# Patient Record
Sex: Female | Born: 1970
Health system: Southern US, Community
[De-identification: ages and names within clinical notes are randomized; demographics above are authoritative.]

## PROBLEM LIST (undated history)

## (undated) DIAGNOSIS — J45909 Unspecified asthma, uncomplicated: Secondary | ICD-10-CM

## (undated) DIAGNOSIS — E119 Type 2 diabetes mellitus without complications: Secondary | ICD-10-CM

## (undated) DIAGNOSIS — I10 Essential (primary) hypertension: Secondary | ICD-10-CM

## (undated) DIAGNOSIS — I509 Heart failure, unspecified: Secondary | ICD-10-CM

## (undated) HISTORY — DX: Heart failure, unspecified: I50.9

---

## 1999-01-02 HISTORY — PX: TUBAL LIGATION: SHX77

## 2018-10-31 ENCOUNTER — Other Ambulatory Visit: Payer: Self-pay

## 2018-10-31 ENCOUNTER — Inpatient Hospital Stay (HOSPITAL_COMMUNITY)
Admission: EM | Admit: 2018-10-31 | Discharge: 2018-11-07 | DRG: 286 | Disposition: A | Discharge status: Home Health | Payer: Self-pay | Attending: Family Medicine | Admitting: Family Medicine

## 2018-10-31 ENCOUNTER — Encounter (HOSPITAL_COMMUNITY): Payer: Self-pay | Admitting: Pharmacy Technician

## 2018-10-31 ENCOUNTER — Emergency Department (HOSPITAL_COMMUNITY): Payer: Self-pay

## 2018-10-31 DIAGNOSIS — E8881 Metabolic syndrome: Secondary | ICD-10-CM | POA: Diagnosis present

## 2018-10-31 DIAGNOSIS — J9601 Acute respiratory failure with hypoxia: Secondary | ICD-10-CM | POA: Diagnosis present

## 2018-10-31 DIAGNOSIS — Z8249 Family history of ischemic heart disease and other diseases of the circulatory system: Secondary | ICD-10-CM

## 2018-10-31 DIAGNOSIS — J9602 Acute respiratory failure with hypercapnia: Secondary | ICD-10-CM | POA: Diagnosis present

## 2018-10-31 DIAGNOSIS — I5043 Acute on chronic combined systolic (congestive) and diastolic (congestive) heart failure: Secondary | ICD-10-CM | POA: Diagnosis present

## 2018-10-31 DIAGNOSIS — I130 Hypertensive heart and chronic kidney disease with heart failure and stage 1 through stage 4 chronic kidney disease, or unspecified chronic kidney disease: Principal | ICD-10-CM | POA: Diagnosis present

## 2018-10-31 DIAGNOSIS — Z23 Encounter for immunization: Secondary | ICD-10-CM

## 2018-10-31 DIAGNOSIS — Z09 Encounter for follow-up examination after completed treatment for conditions other than malignant neoplasm: Secondary | ICD-10-CM

## 2018-10-31 DIAGNOSIS — I509 Heart failure, unspecified: Secondary | ICD-10-CM

## 2018-10-31 DIAGNOSIS — Z86718 Personal history of other venous thrombosis and embolism: Secondary | ICD-10-CM

## 2018-10-31 DIAGNOSIS — Z20828 Contact with and (suspected) exposure to other viral communicable diseases: Secondary | ICD-10-CM | POA: Diagnosis present

## 2018-10-31 DIAGNOSIS — Z9119 Patient's noncompliance with other medical treatment and regimen: Secondary | ICD-10-CM

## 2018-10-31 DIAGNOSIS — Z841 Family history of disorders of kidney and ureter: Secondary | ICD-10-CM

## 2018-10-31 DIAGNOSIS — E873 Alkalosis: Secondary | ICD-10-CM | POA: Diagnosis not present

## 2018-10-31 DIAGNOSIS — E119 Type 2 diabetes mellitus without complications: Secondary | ICD-10-CM

## 2018-10-31 DIAGNOSIS — R918 Other nonspecific abnormal finding of lung field: Secondary | ICD-10-CM | POA: Diagnosis present

## 2018-10-31 DIAGNOSIS — N289 Disorder of kidney and ureter, unspecified: Secondary | ICD-10-CM

## 2018-10-31 DIAGNOSIS — I10 Essential (primary) hypertension: Secondary | ICD-10-CM | POA: Diagnosis present

## 2018-10-31 DIAGNOSIS — E876 Hypokalemia: Secondary | ICD-10-CM | POA: Diagnosis not present

## 2018-10-31 DIAGNOSIS — I890 Lymphedema, not elsewhere classified: Secondary | ICD-10-CM | POA: Diagnosis present

## 2018-10-31 DIAGNOSIS — G9349 Other encephalopathy: Secondary | ICD-10-CM | POA: Diagnosis present

## 2018-10-31 DIAGNOSIS — E662 Morbid (severe) obesity with alveolar hypoventilation: Secondary | ICD-10-CM | POA: Diagnosis present

## 2018-10-31 DIAGNOSIS — N182 Chronic kidney disease, stage 2 (mild): Secondary | ICD-10-CM | POA: Diagnosis present

## 2018-10-31 DIAGNOSIS — N179 Acute kidney failure, unspecified: Secondary | ICD-10-CM | POA: Diagnosis present

## 2018-10-31 DIAGNOSIS — J189 Pneumonia, unspecified organism: Secondary | ICD-10-CM | POA: Diagnosis present

## 2018-10-31 DIAGNOSIS — Z6841 Body Mass Index (BMI) 40.0 and over, adult: Secondary | ICD-10-CM

## 2018-10-31 DIAGNOSIS — J45909 Unspecified asthma, uncomplicated: Secondary | ICD-10-CM | POA: Diagnosis present

## 2018-10-31 DIAGNOSIS — R0902 Hypoxemia: Secondary | ICD-10-CM

## 2018-10-31 DIAGNOSIS — Z79899 Other long term (current) drug therapy: Secondary | ICD-10-CM

## 2018-10-31 DIAGNOSIS — J45901 Unspecified asthma with (acute) exacerbation: Secondary | ICD-10-CM | POA: Diagnosis present

## 2018-10-31 DIAGNOSIS — I272 Pulmonary hypertension, unspecified: Secondary | ICD-10-CM | POA: Diagnosis present

## 2018-10-31 DIAGNOSIS — E1122 Type 2 diabetes mellitus with diabetic chronic kidney disease: Secondary | ICD-10-CM | POA: Diagnosis present

## 2018-10-31 DIAGNOSIS — J9690 Respiratory failure, unspecified, unspecified whether with hypoxia or hypercapnia: Secondary | ICD-10-CM

## 2018-10-31 DIAGNOSIS — I428 Other cardiomyopathies: Secondary | ICD-10-CM | POA: Diagnosis present

## 2018-10-31 DIAGNOSIS — Z825 Family history of asthma and other chronic lower respiratory diseases: Secondary | ICD-10-CM

## 2018-10-31 HISTORY — DX: Unspecified asthma, uncomplicated: J45.909

## 2018-10-31 HISTORY — DX: Type 2 diabetes mellitus without complications: E11.9

## 2018-10-31 HISTORY — DX: Essential (primary) hypertension: I10

## 2018-10-31 LAB — CBC WITH DIFFERENTIAL/PLATELET
Abs Immature Granulocytes: 0.05 10*3/uL (ref 0.00–0.07)
Basophils Absolute: 0 10*3/uL (ref 0.0–0.1)
Basophils Relative: 0 %
Eosinophils Absolute: 0 10*3/uL (ref 0.0–0.5)
Eosinophils Relative: 0 %
HCT: 41.6 % (ref 36.0–46.0)
Hemoglobin: 12.5 g/dL (ref 12.0–15.0)
Immature Granulocytes: 1 %
Lymphocytes Relative: 9 %
Lymphs Abs: 1 10*3/uL (ref 0.7–4.0)
MCH: 29.3 pg (ref 26.0–34.0)
MCHC: 30 g/dL (ref 30.0–36.0)
MCV: 97.7 fL (ref 80.0–100.0)
Monocytes Absolute: 0.7 10*3/uL (ref 0.1–1.0)
Monocytes Relative: 6 %
Neutro Abs: 9 10*3/uL — ABNORMAL HIGH (ref 1.7–7.7)
Neutrophils Relative %: 84 %
Platelets: 366 10*3/uL (ref 150–400)
RBC: 4.26 MIL/uL (ref 3.87–5.11)
RDW: 16 % — ABNORMAL HIGH (ref 11.5–15.5)
WBC: 10.7 10*3/uL — ABNORMAL HIGH (ref 4.0–10.5)
nRBC: 0 % (ref 0.0–0.2)

## 2018-10-31 LAB — COMPREHENSIVE METABOLIC PANEL
ALT: 23 U/L (ref 0–44)
AST: 13 U/L — ABNORMAL LOW (ref 15–41)
Albumin: 3.3 g/dL — ABNORMAL LOW (ref 3.5–5.0)
Alkaline Phosphatase: 72 U/L (ref 38–126)
Anion gap: 10 (ref 5–15)
BUN: 21 mg/dL — ABNORMAL HIGH (ref 6–20)
CO2: 31 mmol/L (ref 22–32)
Calcium: 8.9 mg/dL (ref 8.9–10.3)
Chloride: 101 mmol/L (ref 98–111)
Creatinine, Ser: 1.45 mg/dL — ABNORMAL HIGH (ref 0.44–1.00)
GFR calc Af Amer: 50 mL/min — ABNORMAL LOW (ref >60)
GFR calc non Af Amer: 43 mL/min — ABNORMAL LOW (ref >60)
Glucose, Bld: 120 mg/dL — ABNORMAL HIGH (ref 70–99)
Potassium: 3.4 mmol/L — ABNORMAL LOW (ref 3.5–5.1)
Sodium: 142 mmol/L (ref 135–145)
Total Bilirubin: 0.5 mg/dL (ref 0.3–1.2)
Total Protein: 6.9 g/dL (ref 6.5–8.1)

## 2018-10-31 LAB — BRAIN NATRIURETIC PEPTIDE: B Natriuretic Peptide: 931.6 pg/mL — ABNORMAL HIGH (ref 0.0–100.0)

## 2018-10-31 NOTE — ED Triage Notes (Signed)
Pt arrives POV with hx of asthma and out of medications. Pt recently moved here and has not established care with a new PCP. Over the last few days pt has become more SOB. Pt in NAD.

## 2018-10-31 NOTE — ED Notes (Signed)
Patient states she needs asthma medicine.

## 2018-10-31 NOTE — ED Notes (Signed)
Pt oxygen saturation improved to 95% on 2L .

## 2018-11-01 ENCOUNTER — Encounter (HOSPITAL_COMMUNITY): Payer: Self-pay | Admitting: Family Medicine

## 2018-11-01 DIAGNOSIS — J189 Pneumonia, unspecified organism: Secondary | ICD-10-CM | POA: Diagnosis present

## 2018-11-01 DIAGNOSIS — I10 Essential (primary) hypertension: Secondary | ICD-10-CM | POA: Diagnosis present

## 2018-11-01 DIAGNOSIS — N289 Disorder of kidney and ureter, unspecified: Secondary | ICD-10-CM

## 2018-11-01 DIAGNOSIS — I509 Heart failure, unspecified: Secondary | ICD-10-CM

## 2018-11-01 DIAGNOSIS — I5022 Chronic systolic (congestive) heart failure: Secondary | ICD-10-CM | POA: Insufficient documentation

## 2018-11-01 DIAGNOSIS — J9691 Respiratory failure, unspecified with hypoxia: Secondary | ICD-10-CM

## 2018-11-01 DIAGNOSIS — J9601 Acute respiratory failure with hypoxia: Secondary | ICD-10-CM | POA: Diagnosis present

## 2018-11-01 DIAGNOSIS — Z86718 Personal history of other venous thrombosis and embolism: Secondary | ICD-10-CM

## 2018-11-01 DIAGNOSIS — J45909 Unspecified asthma, uncomplicated: Secondary | ICD-10-CM | POA: Diagnosis present

## 2018-11-01 DIAGNOSIS — E119 Type 2 diabetes mellitus without complications: Secondary | ICD-10-CM

## 2018-11-01 HISTORY — DX: Acute respiratory failure with hypoxia: J96.01

## 2018-11-01 HISTORY — DX: Respiratory failure, unspecified with hypoxia: J96.91

## 2018-11-01 LAB — GLUCOSE, CAPILLARY
Glucose-Capillary: 162 mg/dL — ABNORMAL HIGH (ref 70–99)
Glucose-Capillary: 199 mg/dL — ABNORMAL HIGH (ref 70–99)
Glucose-Capillary: 167 mg/dL — ABNORMAL HIGH (ref 70–99)

## 2018-11-01 LAB — SARS CORONAVIRUS 2 (TAT 6-24 HRS): SARS Coronavirus 2: NEGATIVE

## 2018-11-01 LAB — STREP PNEUMONIAE URINARY ANTIGEN: Strep Pneumo Urinary Antigen: NEGATIVE

## 2018-11-01 LAB — MAGNESIUM: Magnesium: 1.8 mg/dL (ref 1.7–2.4)

## 2018-11-01 LAB — TROPONIN I (HIGH SENSITIVITY)
Troponin I (High Sensitivity): 85 ng/L — ABNORMAL HIGH (ref <18)
Troponin I (High Sensitivity): 81 ng/L — ABNORMAL HIGH (ref <18)

## 2018-11-01 LAB — CBG MONITORING, ED: Glucose-Capillary: 171 mg/dL — ABNORMAL HIGH (ref 70–99)

## 2018-11-01 LAB — HEMOGLOBIN A1C
Hgb A1c MFr Bld: 8.5 % — ABNORMAL HIGH (ref 4.8–5.6)
Mean Plasma Glucose: 197.25 mg/dL

## 2018-11-01 LAB — HIV ANTIBODY (ROUTINE TESTING W REFLEX): HIV Screen 4th Generation wRfx: NONREACTIVE

## 2018-11-01 LAB — PROCALCITONIN: Procalcitonin: <0.1 ng/mL

## 2018-11-01 MED ORDER — HYDROCODONE-ACETAMINOPHEN 5-325 MG PO TABS
1.0000 | ORAL_TABLET | Freq: Four times a day (QID) | ORAL | Status: DC | PRN
  Administered 2018-11-01: 2 via ORAL
  Administered 2018-11-02: 1 via ORAL
  Filled 2018-11-01: qty 1
  Filled 2018-11-01: qty 2

## 2018-11-01 MED ORDER — POLYETHYLENE GLYCOL 3350 17 G PO PACK: 17.0000 g | PACK | Freq: Every day | ORAL | Status: DC | PRN

## 2018-11-01 MED ORDER — ACETAMINOPHEN 650 MG RE SUPP: 650.0000 mg | Freq: Four times a day (QID) | RECTAL | Status: DC | PRN

## 2018-11-01 MED ORDER — ENOXAPARIN SODIUM 40 MG/0.4ML ~~LOC~~ SOLN
40.0000 mg | SUBCUTANEOUS | Status: DC
  Administered 2018-11-01 – 2018-11-04 (×4): 40 mg via SUBCUTANEOUS
  Filled 2018-11-01 (×4): qty 0.4

## 2018-11-01 MED ORDER — FUROSEMIDE 10 MG/ML IJ SOLN
40.0000 mg | Freq: Two times a day (BID) | INTRAMUSCULAR | Status: DC
  Administered 2018-11-01 – 2018-11-04 (×7): 40 mg via INTRAVENOUS
  Filled 2018-11-01 (×7): qty 4

## 2018-11-01 MED ORDER — IPRATROPIUM BROMIDE HFA 17 MCG/ACT IN AERS
2.0000 | INHALATION_SPRAY | Freq: Once | RESPIRATORY_TRACT | Status: AC
  Administered 2018-11-01: 2 via RESPIRATORY_TRACT
  Filled 2018-11-01: qty 12.9

## 2018-11-01 MED ORDER — ALBUTEROL SULFATE HFA 108 (90 BASE) MCG/ACT IN AERS
6.0000 | INHALATION_SPRAY | Freq: Once | RESPIRATORY_TRACT | Status: AC
  Administered 2018-11-01: 6 via RESPIRATORY_TRACT
  Filled 2018-11-01: qty 6.7

## 2018-11-01 MED ORDER — ONDANSETRON HCL 4 MG/2ML IJ SOLN: 4.0000 mg | Freq: Four times a day (QID) | INTRAMUSCULAR | Status: DC | PRN

## 2018-11-01 MED ORDER — POTASSIUM CHLORIDE CRYS ER 20 MEQ PO TBCR
20.0000 meq | EXTENDED_RELEASE_TABLET | Freq: Every day | ORAL | Status: DC
  Administered 2018-11-01 – 2018-11-03 (×3): 20 meq via ORAL
  Filled 2018-11-01 (×4): qty 1

## 2018-11-01 MED ORDER — SODIUM CHLORIDE 0.9 % IV SOLN
500.0000 mg | INTRAVENOUS | Status: DC
  Administered 2018-11-01: 500 mg via INTRAVENOUS
  Filled 2018-11-01 (×2): qty 500

## 2018-11-01 MED ORDER — AMLODIPINE BESYLATE 10 MG PO TABS
10.0000 mg | ORAL_TABLET | Freq: Every day | ORAL | Status: DC
  Administered 2018-11-01 – 2018-11-04 (×4): 10 mg via ORAL
  Filled 2018-11-01 (×4): qty 1
  Filled 2018-11-01: qty 2

## 2018-11-01 MED ORDER — ACETAMINOPHEN 325 MG PO TABS
650.0000 mg | ORAL_TABLET | Freq: Four times a day (QID) | ORAL | Status: DC | PRN
  Administered 2018-11-02: 650 mg via ORAL
  Filled 2018-11-01: qty 2

## 2018-11-01 MED ORDER — POTASSIUM CHLORIDE 10 MEQ/100ML IV SOLN
10.0000 meq | Freq: Once | INTRAVENOUS | Status: AC
  Administered 2018-11-01: 10 meq via INTRAVENOUS
  Filled 2018-11-01: qty 100

## 2018-11-01 MED ORDER — IPRATROPIUM-ALBUTEROL 0.5-2.5 (3) MG/3ML IN SOLN
3.0000 mL | RESPIRATORY_TRACT | Status: DC
  Administered 2018-11-01 – 2018-11-02 (×3): 3 mL via RESPIRATORY_TRACT
  Filled 2018-11-01 (×3): qty 3

## 2018-11-01 MED ORDER — ALBUTEROL SULFATE (2.5 MG/3ML) 0.083% IN NEBU: 2.5000 mg | INHALATION_SOLUTION | Freq: Four times a day (QID) | RESPIRATORY_TRACT | Status: DC | PRN

## 2018-11-01 MED ORDER — METHYLPREDNISOLONE SODIUM SUCC 125 MG IJ SOLR
125.0000 mg | Freq: Once | INTRAMUSCULAR | Status: AC
  Administered 2018-11-01: 125 mg via INTRAVENOUS
  Filled 2018-11-01: qty 2

## 2018-11-01 MED ORDER — FLUTICASONE FUROATE-VILANTEROL 200-25 MCG/INH IN AEPB
1.0000 | INHALATION_SPRAY | Freq: Every day | RESPIRATORY_TRACT | Status: DC
  Filled 2018-11-01: qty 28

## 2018-11-01 MED ORDER — SODIUM CHLORIDE 0.9 % IV SOLN
250.0000 mL | INTRAVENOUS | Status: DC | PRN
  Administered 2018-11-02: 13:00:00 250 mL via INTRAVENOUS

## 2018-11-01 MED ORDER — SODIUM CHLORIDE 0.9% FLUSH
3.0000 mL | INTRAVENOUS | Status: DC | PRN
  Administered 2018-11-04: 10:00:00 3 mL via INTRAVENOUS
  Filled 2018-11-01: qty 3

## 2018-11-01 MED ORDER — MONTELUKAST SODIUM 10 MG PO TABS
10.0000 mg | ORAL_TABLET | Freq: Every day | ORAL | Status: DC
  Administered 2018-11-01 – 2018-11-06 (×6): 10 mg via ORAL
  Filled 2018-11-01 (×6): qty 1

## 2018-11-01 MED ORDER — AZITHROMYCIN 250 MG PO TABS
500.0000 mg | ORAL_TABLET | Freq: Every day | ORAL | Status: DC
  Administered 2018-11-02: 500 mg via ORAL
  Filled 2018-11-01: qty 2

## 2018-11-01 MED ORDER — SODIUM CHLORIDE 0.9 % IV SOLN
2.0000 g | INTRAVENOUS | Status: DC
  Administered 2018-11-01 – 2018-11-02 (×2): 2 g via INTRAVENOUS
  Filled 2018-11-01 (×2): qty 20

## 2018-11-01 MED ORDER — MAGNESIUM SULFATE 2 GM/50ML IV SOLN
2.0000 g | Freq: Once | INTRAVENOUS | Status: AC
  Administered 2018-11-01: 2 g via INTRAVENOUS
  Filled 2018-11-01: qty 50

## 2018-11-01 MED ORDER — FUROSEMIDE 10 MG/ML IJ SOLN
40.0000 mg | Freq: Once | INTRAMUSCULAR | Status: AC
  Administered 2018-11-01: 40 mg via INTRAVENOUS
  Filled 2018-11-01: qty 4

## 2018-11-01 MED ORDER — ONDANSETRON HCL 4 MG PO TABS: 4.0000 mg | ORAL_TABLET | Freq: Four times a day (QID) | ORAL | Status: DC | PRN

## 2018-11-01 MED ORDER — SODIUM CHLORIDE 0.9% FLUSH
3.0000 mL | Freq: Two times a day (BID) | INTRAVENOUS | Status: DC
  Administered 2018-11-01 – 2018-11-05 (×8): 3 mL via INTRAVENOUS

## 2018-11-01 MED ORDER — INSULIN ASPART 100 UNIT/ML ~~LOC~~ SOLN
0.0000 [IU] | Freq: Three times a day (TID) | SUBCUTANEOUS | Status: DC
  Administered 2018-11-01 (×3): 2 [IU] via SUBCUTANEOUS
  Administered 2018-11-02 – 2018-11-03 (×3): 1 [IU] via SUBCUTANEOUS
  Administered 2018-11-03: 09:00:00 3 [IU] via SUBCUTANEOUS
  Administered 2018-11-04: 18:00:00 1 [IU] via SUBCUTANEOUS

## 2018-11-01 NOTE — Progress Notes (Addendum)
A 48 year old female with past medical history of asthma, diabetes, OSA on CPAP, hypertension, morbid obesity, DVT, who presented to the ED with progressively worsening shortness of breath, initially on exertion now at rest and admitted for CHF exacerbation as well as right upper lobe pneumonia.  In the ED patient was tachycardic, hypoxic, tachypneic.  She had chest x-ray which showed right upper lobe consolidation concerning for pneumonia, right pleural effusion, severe cardiomegaly and pulmonary vascular congestion.  She had elevated creatinine 1.45 and BNP 932.  She was started on IV Lasix, albuterol, Atrovent and received 125 mg IV Solu-Medrol in the emergency department.  Today, patient admits that she has not noticed much weight gain as she has been intentionally losing weight has lost 21 pounds over the past couple months with increased activity and improved diet with carbohydrate restriction and salt restriction.  Has not had increased salt intake recently.  Notes that she has had worsening bilateral lower extremity edema up to her thighs.  Denies any recent sick contacts.  Admits to wheezing at home which has been uncontrollable with her home medications.    Otherwise review of systems negative  Physical Exam Vitals signs and nursing note reviewed.  Constitutional:      Appearance: She is obese.  HENT:     Head: Normocephalic.     Mouth/Throat:     Mouth: Mucous membranes are moist.  Eyes:     Extraocular Movements: Extraocular movements intact.  Cardiovascular:     Rate and Rhythm: Regular rhythm. Tachycardia present.     Comments: Distant heart sounds due to body habitus Pulmonary:     Breath sounds: Wheezing and rales present.     Comments: With some increased work of breathing, sitting upright with conversational dyspnea, satting well with nasal cannula.  Diffuse inspiratory and expiratory wheeze Musculoskeletal:     Comments: 2-3+ bilateral lower extremity pitting edema to  knees  Neurological:     General: No focal deficit present.     Mental Status: She is alert and oriented to person, place, and time.  Psychiatric:        Mood and Affect: Mood normal. Mood is not anxious.        Behavior: Behavior normal. Behavior is not agitated.    Assessment/plan  Acute hypoxic respiratory failure secondary to CAP and suspected new onset heart failure O2 sat in 80s on room air at presentation.  Currently satting well on 2 L nasal cannula -Treatment as below  Suspected new onset heart failure exacerbation Unknown etiology as patient has been compliant with low-salt and heart healthy diet at home to lose weight.  BNP may be falsely low at 932 in morbidly obese patient may actually be 1000+.  Mag sulfate 2 g Lasix 40 mg IV x2. -Continue Lasix 40 mg IV twice daily -Follow-up echo -Consider cardiology consult for new onset heart failure.  Likely need outpatient work-up  CAP with right upper lobe consolidation Mild leukocytosis.  Afebrile with negative procalcitonin -Follow-up sputum culture -Continue CAP therapy for now -Repeat labs and chest x-ray tomorrow morning  Asthma exacerbation secondary to CAP and new onset heart failure Received mag sulfate 2 g in ED.  Estimated peak flow 370 L/min -DuoNeb scheduled -Albuterol neb as needed worse -Peak flows  OSA on CPAP -Continue CPAP  Elevated creatinine Unknown baseline.  Suspect secondary to above -Continue Lasix -Follow-up in a.m.   Hypertension -Continue Norvasc and Lasix -Likely start beta-blocker once volume status is improved  Diabetes  HA1C 8.5  Continue sliding scale -Needs to establish care with PCP  History of DVT   Whitney Post, DO [facility] [phone]

## 2018-11-01 NOTE — TOC Initial Note (Signed)
Transition of Care [facility]) - Initial/Assessment Note    Patient Details  Name: Julie Benjamin MRN: [id number] Date of Birth: 02-12-1970  Transition of Care [facility]) CM/SW Contact:    Carles Collet, RN Phone Number: 11/01/2018, 3:11 PM  Clinical Narrative:         She moved to this area from [place] and has not yet established with a PCP or filled her medications. Patient is uninsured. Will continue to follow for discharge planning.  Will request CMA to establish patient at [facility], and watch for [place] needs at DC.             Expected Discharge Plan: Home/Self Care Barriers to Discharge: Continued Medical Work up   Patient Goals and CMS Choice        Expected Discharge Plan and Services Expected Discharge Plan: Home/Self Care                                              Prior Living Arrangements/Services                       Activities of Daily Living      Permission Sought/Granted                  Emotional Assessment              Admission diagnosis:  Hypoxia [R09.02] AKI (acute kidney injury) ([place]) [N17.9] Follow up [Z09] Acute heart failure, unspecified heart failure type [facility] [I50.9] Patient Active Problem List   Diagnosis Date Noted  . Acute respiratory failure with hypoxia ([place]) 11/01/2018  . Hypertension   . Diabetes mellitus without complication ([place])   . Asthma   . Right upper lobe pneumonia   . Renal insufficiency   . History of DVT of lower extremity   . Acute heart failure [facility])    PCP:  Patient, No Pcp Per Pharmacy:   [facility] [place], [place] - [address] [place] [address] Phone: [phone] Fax: [phone]     Social Determinants of Health (SDOH) Interventions    Readmission Risk Interventions No flowsheet data found.

## 2018-11-01 NOTE — ED Notes (Signed)
Lunch Tray Ordered @ 1043. 

## 2018-11-01 NOTE — H&P (Signed)
History and Physical    Shandie Bertz DPO:[id number] DOB: 03-18-1970 DOA: 10/31/2018  PCP: Patient, No Pcp Per   Patient coming from: Home   Chief Complaint: SOB   HPI: Chloie Loney is a 48 y.o. female with medical history significant for asthma, diabetes mellitus, and hypertension, now presenting to the emergency department with shortness of breath.  Patient reports chronic bilateral lower extremity edema, but has not had any lower extremity tenderness.  She also reports orthopnea but this seems to be chronic as well.  Over the past couple weeks, she has become increasingly short of breath.  Initially she was dyspneic with exertion, but now short of breath at rest as well.  She has not noted any fevers or chills, reports that she never coughs much, and denies any chest pain associated with this.  She reports a history of asthma but denies any wheezing recently.  She moved to this area from [place] and has not yet established with a PCP or filled her medications.  ED Course: Upon arrival to the ED, patient is found to be afebrile, saturating upper 80s on room air, tachypneic, tachycardic in the 110s, and with stable blood pressure.  EKG features sinus tachycardia with rate 114 and chest x-ray is concerning for right upper lobe pneumonia, tiny right pleural effusion, and severe cardiomegaly with pulmonary vascular congestion.  Chemistry panel is notable for potassium 3.4 and creatinine 1.45.  CBC features a mild leukocytosis.  BNP is elevated to 932.  Patient was treated with IV Lasix, albuterol, Atrovent, and 125 mg IV Solu-Medrol in the emergency department.  COVID-19 testing is in process.  Troponin ordered but not yet resulted.  Review of Systems:  All other systems reviewed and apart from HPI, are negative.  Past Medical History:  Diagnosis Date  . Asthma   . Diabetes mellitus without complication ([place])   . Hypertension     History reviewed. No pertinent surgical history.   has  no history on file for tobacco, alcohol, and drug.  No Known Allergies  History reviewed. No pertinent family history.   Prior to Admission medications   Not on File    Physical Exam: Vitals:   11/01/18 0046 11/01/18 0048 11/01/18 0200 11/01/18 0201  BP: (!) 195/128 (!) 173/120 (!) 160/124   Pulse: (!) 117   (!) 117  Resp: (!) 23  (!) 22 20  Temp:      TempSrc:      SpO2: 100%   98%  Weight:      Height:        Constitutional: NAD, calm  Eyes: PERTLA, lids and conjunctivae normal ENMT: Mucous membranes are moist. Posterior pharynx clear of any exudate or lesions.   Neck: normal, supple, no masses, no thyromegaly Respiratory: Tachypnea. Dyspnea with speech. No pallor or cyanosis. No wheezing.  Cardiovascular: S1 & S2 heard, regular rate and rhythm. Pitting edema to bilateral LE's. Abdomen: No distension, no tenderness, soft. Bowel sounds normal.  Musculoskeletal: no clubbing / cyanosis. No joint deformity upper and lower extremities.  Skin: no significant rashes, lesions, ulcers. Warm, dry, well-perfused. Neurologic: No facial asymmetry. Sensation intact. Moving all extremities.  Psychiatric: Alert and oriented to person, place, and situation. Very pleasant, cooperative.    Labs on Admission: I have personally reviewed following labs and imaging studies  CBC: Recent Labs  Lab 10/31/18 1947  WBC 10.7*  NEUTROABS 9.0*  HGB 12.5  HCT 41.6  MCV 97.7  PLT 431   Basic Metabolic  Panel: Recent Labs  Lab 10/31/18 1947  NA 142  K 3.4*  CL 101  CO2 31  GLUCOSE 120*  BUN 21*  CREATININE 1.45*  CALCIUM 8.9   GFR: Estimated Creatinine Clearance: 64.9 mL/min (A) (by C-G formula based on SCr of 1.45 mg/dL (H)). Liver Function Tests: Recent Labs  Lab 10/31/18 1947  AST 13*  ALT 23  ALKPHOS 72  BILITOT 0.5  PROT 6.9  ALBUMIN 3.3*   No results for input(s): LIPASE, AMYLASE in the last 168 hours. No results for input(s): AMMONIA in the last 168 hours.  Coagulation Profile: No results for input(s): INR, PROTIME in the last 168 hours. Cardiac Enzymes: No results for input(s): CKTOTAL, CKMB, CKMBINDEX, TROPONINI in the last 168 hours. BNP (last 3 results) No results for input(s): PROBNP in the last 8760 hours. HbA1C: No results for input(s): HGBA1C in the last 72 hours. CBG: No results for input(s): GLUCAP in the last 168 hours. Lipid Profile: No results for input(s): CHOL, HDL, LDLCALC, TRIG, CHOLHDL, LDLDIRECT in the last 72 hours. Thyroid Function Tests: No results for input(s): TSH, T4TOTAL, FREET4, T3FREE, THYROIDAB in the last 72 hours. Anemia Panel: No results for input(s): VITAMINB12, FOLATE, FERRITIN, TIBC, IRON, RETICCTPCT in the last 72 hours. Urine analysis: No results found for: COLORURINE, APPEARANCEUR, LABSPEC, PHURINE, GLUCOSEU, HGBUR, BILIRUBINUR, KETONESUR, PROTEINUR, UROBILINOGEN, NITRITE, LEUKOCYTESUR Sepsis Labs: @LABRCNTIP (procalcitonin:4,lacticidven:4) )No results found for this or any previous visit (from the past 240 hour(s)).   Radiological Exams on Admission: Dg Chest 2 View  Result Date: 10/31/2018 CLINICAL DATA:  Progressive shortness of breath over several days. Asthma. EXAM: CHEST - 2 VIEW COMPARISON:  None. FINDINGS: Severe cardiomegaly is demonstrated, as well as pulmonary vascular congestion. Ill-defined area of airspace opacity is seen in the right upper lobe, suspicious for pneumonia. Tiny right pleural effusion is also seen. Several old bilateral rib fracture deformities are noted. IMPRESSION: Right upper lobe airspace opacity, suspicious for pneumonia. Recommend chest radiographic follow-up to confirm resolution. Tiny right pleural effusion. Severe cardiomegaly with pulmonary vascular congestion. Electronically Signed   By: 11/02/2018 M.D.   On: 10/31/2018 18:50    EKG: Independently reviewed. Sinus tachycardia (rate 114), RAD.   Assessment/Plan   1. Acute CHF   - Presents with SOB, found to  have peripheral edema, BNP 932, and severe cardiomegaly with vascular congestion on CXR   - Patient not aware of CHF history and denies ever having echo  - She was given Lasix 40 mg IV in ED  - Continue diuresis with Lasix 40 mg IV q12h, check echocardiogram, follow daily wt and I/O's, and monitor renal function and electrolytes during diuresis   2. Pneumonia  - Presents with SOB, found to have mild hypoxia, leukocytosis, and RUL pneumonia on CXR  - Check sputum culture and strep pneumo and legionella antigens - Start Rocephin and azithromycin, continue supplemental O2 as needed    3. Asthma  - Patient reports hx of asthma, presents with SOB, not wheezing  - Continue ICS/LABA, Singulair, and as-needed albuterol    4. Renal insufficiency  - SCr is 1.45 on admission with no prior labs available  - Renally-dose medications, monitor renal function during diuresis   5. Hypertension  - Continue Norvasc   6. Type II DM  - Patient reports hx of DM but not on any medications for this and serum glucose 120 in ED  - Check CBG's, check A1c, use SSI with Novolog if needed    8. History  of DVT  - Patient reports remote hx of RLE DVT  - She has bilateral LE edema, but no leg tenderness, and no chest pain to suggest acute VTE     PPE: mask, face shield  DVT prophylaxis: Lovenox  Code Status: Full  Family Communication: Discussed with patient  Consults called: None  Admission status: Inpatient. Patient has multiple acute problems including acute undifferentiated CHF and pneumonia, has new supplemental O2-requirement, and will require inpatient management.    Briscoe Deutscher, MD [facility] Pager [phone]  If 7PM-7AM, please contact night-coverage [email or web address] Password [email or web address]  11/01/2018, 2:21 AM

## 2018-11-01 NOTE — ED Notes (Signed)
Pt walked to bathroom and back independently, O2 sats 80% upon return to bed. Placed back on 2L O2 with improvement to 96%.

## 2018-11-01 NOTE — ED Notes (Signed)
Breakfast ordered 

## 2018-11-01 NOTE — ED Provider Notes (Signed)
[facility] EMERGENCY DEPARTMENT Provider Note  CSN: [id number] Arrival date & time: 10/31/18 1642  Chief Complaint(s) Shortness of Breath  HPI Julie Benjamin is a 48 y.o. female with a past medical history listed below including asthma, hypertension, diabetes who presents to the emergency department with several weeks of gradually worsening shortness of breath not typical of her asthma exacerbations.  Patient endorsing dysuria with exertion.  Also endorsing orthopnea.  Patient reports peripheral edema which she attributes to hypertension.  Patient has been having minimal improvement with albuterol and home meds.  She has never been diagnosed with heart failure.  Recently moved from [place] a month and a half ago.  Denies any recent fevers or infections.  No cough or congestion.  No exposure to Covid.  Patient does endorse a remote history of DVT.  HPI  Past Medical History Past Medical History:  Diagnosis Date   Asthma    Diabetes mellitus without complication (HCC)    Hypertension    Patient Active Problem List   Diagnosis Date Noted   Acute respiratory failure with hypoxia (HCC) 11/01/2018   Hypertension    Diabetes mellitus without complication (HCC)    Asthma    Right upper lobe pneumonia    Renal insufficiency    History of pulmonary embolism    Home Medication(s) Prior to Admission medications   Not on File                                                                                                                                    Past Surgical History ** The histories are not reviewed yet. Please review them in the "History" navigator section and refresh this SmartLink. Family History History reviewed. No pertinent family history.  Social History Social History   Tobacco Use   Smoking status: Not on file  Substance Use Topics   Alcohol use: Not on file   Drug use: Not on file   Allergies Patient has no known  allergies.  Review of Systems Review of Systems All other systems are reviewed and are negative for acute change except as noted in the HPI  Physical Exam Vital Signs  I have reviewed the triage vital signs BP (!) 175/100 (BP Location: Left Arm)    Pulse (!) 117    Temp 98.2 F (36.8 C) (Oral)    Resp (!) 23    Ht 5\' 3"  (1.6 m)    Wt 135.6 kg    SpO2 100%    BMI 52.97 kg/m   Physical Exam Vitals signs reviewed.  Constitutional:      General: She is not in acute distress.    Appearance: She is well-developed. She is not diaphoretic.  HENT:     Head: Normocephalic and atraumatic.     Nose: Nose normal.  Eyes:     General: No scleral icterus.       Right eye:  No discharge.        Left eye: No discharge.     Conjunctiva/sclera: Conjunctivae normal.     Pupils: Pupils are equal, round, and reactive to light.  Neck:     Musculoskeletal: Normal range of motion and neck supple.  Cardiovascular:     Rate and Rhythm: Regular rhythm. Tachycardia present.     Heart sounds: No murmur. No friction rub. No gallop.   Pulmonary:     Effort: Pulmonary effort is normal. Tachypnea present. No respiratory distress.     Breath sounds: Normal breath sounds. No stridor. No wheezing, rhonchi or rales.     Comments: Speaks in short sentences. Winded quickly. Abdominal:     General: There is no distension.     Palpations: Abdomen is soft.     Tenderness: There is no abdominal tenderness.  Musculoskeletal:        General: No tenderness.     Right lower leg: 1+ Pitting Edema present.     Left lower leg: 1+ Pitting Edema present.  Skin:    General: Skin is warm and dry.     Findings: No erythema or rash.  Neurological:     Mental Status: She is alert and oriented to person, place, and time.     ED Results and Treatments Labs (all labs ordered are listed, but only abnormal results are displayed) Labs Reviewed  BRAIN NATRIURETIC PEPTIDE - Abnormal; Notable for the following components:       Result Value   B Natriuretic Peptide 931.6 (*)    All other components within normal limits  CBC WITH DIFFERENTIAL/PLATELET - Abnormal; Notable for the following components:   WBC 10.7 (*)    RDW 16.0 (*)    Neutro Abs 9.0 (*)    All other components within normal limits  COMPREHENSIVE METABOLIC PANEL - Abnormal; Notable for the following components:   Potassium 3.4 (*)    Glucose, Bld 120 (*)    BUN 21 (*)    Creatinine, Ser 1.45 (*)    Albumin 3.3 (*)    AST 13 (*)    GFR calc non Af Amer 43 (*)    GFR calc Af Amer 50 (*)    All other components within normal limits  SARS CORONAVIRUS 2 (TAT 6-24 HRS)  TROPONIN I (HIGH SENSITIVITY)                                                                                                                         EKG  EKG Interpretation  Date/Time:  Friday October 31 2018 17:43:03 EDT Ventricular Rate:  114 PR Interval:  148 QRS Duration: 88 QT Interval:  362 QTC Calculation: 498 R Axis:   -130 Text Interpretation: Sinus tachycardia Biatrial enlargement Right superior axis deviation Cannot rule out Anterior infarct , age undetermined Abnormal ECG No old tracing to compare Confirmed by Dione BoozeGlick, David ([id number]) on 11/01/2018 12:18:11 AM      Radiology Dg Chest 2 View  Result Date: 10/31/2018 CLINICAL DATA:  Progressive shortness of breath over several days. Asthma. EXAM: CHEST - 2 VIEW COMPARISON:  None. FINDINGS: Severe cardiomegaly is demonstrated, as well as pulmonary vascular congestion. Ill-defined area of airspace opacity is seen in the right upper lobe, suspicious for pneumonia. Tiny right pleural effusion is also seen. Several old bilateral rib fracture deformities are noted. IMPRESSION: Right upper lobe airspace opacity, suspicious for pneumonia. Recommend chest radiographic follow-up to confirm resolution. Tiny right pleural effusion. Severe cardiomegaly with pulmonary vascular congestion. Electronically Signed   By: Marlaine Hind M.D.    On: 10/31/2018 18:50    Pertinent labs & imaging results that were available during my care of the patient were reviewed by me and considered in my medical decision making (see chart for details).  Medications Ordered in ED Medications  potassium chloride 10 mEq in 100 mL IVPB (10 mEq Intravenous New Bag/Given 11/01/18 0155)  albuterol (VENTOLIN HFA) 108 (90 Base) MCG/ACT inhaler 6 puff (6 puffs Inhalation Given 11/01/18 0145)  ipratropium (ATROVENT HFA) inhaler 2 puff (2 puffs Inhalation Given 11/01/18 0207)  methylPREDNISolone sodium succinate (SOLU-MEDROL) 125 mg/2 mL injection 125 mg (125 mg Intravenous Given 11/01/18 0145)  furosemide (LASIX) injection 40 mg (40 mg Intravenous Given 11/01/18 0145)                                                                                                                                    Procedures .Critical Care Performed by: Fatima Blank, MD Authorized by: Fatima Blank, MD     CRITICAL CARE Performed by: Grayce Sessions Cherrell Maybee Total critical care time: 30 minutes Critical care time was exclusive of separately billable procedures and treating other patients. Critical care was necessary to treat or prevent imminent or life-threatening deterioration. Critical care was time spent personally by me on the following activities: development of treatment plan with patient and/or surrogate as well as nursing, discussions with consultants, evaluation of patient's response to treatment, examination of patient, obtaining history from patient or surrogate, ordering and performing treatments and interventions, ordering and review of laboratory studies, ordering and review of radiographic studies, pulse oximetry and re-evaluation of patient's condition.   (including critical care time)  Medical Decision Making / ED Course I have reviewed the nursing notes for this encounter and the patient's prior records (if available in EHR or on  provided paperwork).   Julie Benjamin was evaluated in Emergency Department on 11/01/2018 for the symptoms described in the history of present illness. She was evaluated in the context of the global COVID-19 pandemic, which necessitated consideration that the patient might be at risk for infection with the SARS-CoV-2 virus that causes COVID-19. Institutional protocols and algorithms that pertain to the evaluation of patients at risk for COVID-19 are in a state of rapid change based on information released by regulatory bodies including the CDC and federal and state organizations. These policies and algorithms  were followed during the patient's care in the ED.  2 weeks of gradually worsening shortness of breath with orthopnea and dyspnea on exertion.  Patient had sats in the high 80s on room air, requiring 2 L nasal cannula.   Work-up consistent with likely heart failure.  Chest x-ray notable for cardiomegaly, pulmonary vascular congestion.  Also noted to have mild upper lobe opacities which was suspicious for pneumonia but patient denies any infectious symptoms.  Possibly early focal pulmonary edema.  Given indolent process, I feel that PE would be less likely.   Given IV lasix. Will admit to medicine for further work up and management.  The patient appears reasonably screened and/or stabilized for discharge and I doubt any other medical condition or other [facility] requiring further screening, evaluation, or treatment in the ED at this time prior to discharge.  The patient is safe for discharge with strict return precautions.       Final Clinical Impression(s) / ED Diagnoses Final diagnoses:  Acute heart failure, unspecified heart failure type (HCC)  Hypoxia  AKI (acute kidney injury) (HCC)      This chart was dictated using voice recognition software.  Despite best efforts to proofread,  errors can occur which can change the documentation meaning.   Nira Conn, MD 11/01/18  [phone]

## 2018-11-02 ENCOUNTER — Inpatient Hospital Stay (HOSPITAL_COMMUNITY): Payer: Self-pay

## 2018-11-02 ENCOUNTER — Encounter (HOSPITAL_COMMUNITY): Payer: Self-pay

## 2018-11-02 DIAGNOSIS — R0602 Shortness of breath: Secondary | ICD-10-CM

## 2018-11-02 DIAGNOSIS — J9602 Acute respiratory failure with hypercapnia: Secondary | ICD-10-CM

## 2018-11-02 LAB — BASIC METABOLIC PANEL
Anion gap: 11 (ref 5–15)
BUN: 26 mg/dL — ABNORMAL HIGH (ref 6–20)
CO2: 30 mmol/L (ref 22–32)
Calcium: 8.9 mg/dL (ref 8.9–10.3)
Chloride: 100 mmol/L (ref 98–111)
Creatinine, Ser: 1.26 mg/dL — ABNORMAL HIGH (ref 0.44–1.00)
GFR calc Af Amer: 59 mL/min — ABNORMAL LOW (ref >60)
GFR calc non Af Amer: 51 mL/min — ABNORMAL LOW (ref >60)
Glucose, Bld: 124 mg/dL — ABNORMAL HIGH (ref 70–99)
Potassium: 4 mmol/L (ref 3.5–5.1)
Sodium: 141 mmol/L (ref 135–145)

## 2018-11-02 LAB — GLUCOSE, CAPILLARY
Glucose-Capillary: 138 mg/dL — ABNORMAL HIGH (ref 70–99)
Glucose-Capillary: 149 mg/dL — ABNORMAL HIGH (ref 70–99)
Glucose-Capillary: 69 mg/dL — ABNORMAL LOW (ref 70–99)
Glucose-Capillary: 153 mg/dL — ABNORMAL HIGH (ref 70–99)

## 2018-11-02 LAB — ECHOCARDIOGRAM COMPLETE
Height: 63 in
Weight: 4797.21 oz

## 2018-11-02 LAB — BLOOD GAS, ARTERIAL
Acid-Base Excess: 11.7 mmol/L — ABNORMAL HIGH (ref 0.0–2.0)
Bicarbonate: 39.2 mmol/L — ABNORMAL HIGH (ref 20.0–28.0)
Drawn by: 535471
FIO2: 28
O2 Saturation: 96.5 %
Patient temperature: 97.8
pCO2 arterial: 95 mmHg (ref 32.0–48.0)
pH, Arterial: 7.239 — ABNORMAL LOW (ref 7.350–7.450)
pO2, Arterial: 98.5 mmHg (ref 83.0–108.0)

## 2018-11-02 LAB — CBC
HCT: 43.3 % (ref 36.0–46.0)
Hemoglobin: 12.9 g/dL (ref 12.0–15.0)
MCH: 29.4 pg (ref 26.0–34.0)
MCHC: 29.8 g/dL — ABNORMAL LOW (ref 30.0–36.0)
MCV: 98.6 fL (ref 80.0–100.0)
Platelets: 424 10*3/uL — ABNORMAL HIGH (ref 150–400)
RBC: 4.39 MIL/uL (ref 3.87–5.11)
RDW: 15.8 % — ABNORMAL HIGH (ref 11.5–15.5)
WBC: 13.7 10*3/uL — ABNORMAL HIGH (ref 4.0–10.5)
nRBC: 0 % (ref 0.0–0.2)

## 2018-11-02 LAB — LEGIONELLA PNEUMOPHILA SEROGP 1 UR AG: L. pneumophila Serogp 1 Ur Ag: NEGATIVE

## 2018-11-02 LAB — MAGNESIUM: Magnesium: 2 mg/dL (ref 1.7–2.4)

## 2018-11-02 LAB — MRSA PCR SCREENING: MRSA by PCR: NEGATIVE

## 2018-11-02 LAB — PROCALCITONIN: Procalcitonin: <0.1 ng/mL

## 2018-11-02 MED ORDER — FAMOTIDINE IN NACL 20-0.9 MG/50ML-% IV SOLN
20.0000 mg | INTRAVENOUS | Status: DC
  Administered 2018-11-02 – 2018-11-03 (×2): 20 mg via INTRAVENOUS
  Filled 2018-11-02 (×2): qty 50

## 2018-11-02 MED ORDER — CHLORHEXIDINE GLUCONATE CLOTH 2 % EX PADS
6.0000 | MEDICATED_PAD | Freq: Every day | CUTANEOUS | Status: DC
  Administered 2018-11-02 – 2018-11-06 (×4): 6 via TOPICAL

## 2018-11-02 MED ORDER — ORAL CARE MOUTH RINSE
15.0000 mL | Freq: Two times a day (BID) | OROMUCOSAL | Status: DC
  Administered 2018-11-02 – 2018-11-06 (×9): 15 mL via OROMUCOSAL

## 2018-11-02 MED ORDER — HYDRALAZINE HCL 10 MG PO TABS
10.0000 mg | ORAL_TABLET | Freq: Once | ORAL | Status: AC
  Administered 2018-11-02: 08:00:00 10 mg via ORAL
  Filled 2018-11-02: qty 1

## 2018-11-02 MED ORDER — FUROSEMIDE 10 MG/ML IJ SOLN
20.0000 mg | Freq: Once | INTRAMUSCULAR | Status: AC
  Administered 2018-11-02: 08:00:00 20 mg via INTRAVENOUS
  Filled 2018-11-02: qty 2

## 2018-11-02 MED ORDER — CARVEDILOL 3.125 MG PO TABS: 3.1250 mg | ORAL_TABLET | Freq: Two times a day (BID) | ORAL | Status: DC

## 2018-11-02 MED ORDER — ORAL CARE MOUTH RINSE
15.0000 mL | OROMUCOSAL | Status: DC
  Administered 2018-11-02: 16:00:00 15 mL via OROMUCOSAL

## 2018-11-02 MED ORDER — DEXTROSE 50 % IV SOLN
1.0000 | Freq: Once | INTRAVENOUS | Status: AC
  Administered 2018-11-02: 16:00:00 50 mL via INTRAVENOUS
  Filled 2018-11-02: qty 50

## 2018-11-02 MED ORDER — HYDRALAZINE HCL 10 MG PO TABS: 10.0000 mg | ORAL_TABLET | Freq: Once | ORAL | Status: DC

## 2018-11-02 MED ORDER — PERFLUTREN LIPID MICROSPHERE
1.0000 mL | INTRAVENOUS | Status: AC | PRN
  Administered 2018-11-02: 15:00:00 2 mL via INTRAVENOUS
  Filled 2018-11-02: qty 10

## 2018-11-02 MED ORDER — PERFLUTREN LIPID MICROSPHERE
INTRAVENOUS | Status: AC
  Administered 2018-11-02: 15:00:00 2 mL via INTRAVENOUS
  Filled 2018-11-02: qty 10

## 2018-11-02 MED ORDER — IPRATROPIUM-ALBUTEROL 0.5-2.5 (3) MG/3ML IN SOLN
3.0000 mL | Freq: Four times a day (QID) | RESPIRATORY_TRACT | Status: DC
  Administered 2018-11-02 – 2018-11-03 (×6): 3 mL via RESPIRATORY_TRACT
  Filled 2018-11-02 (×6): qty 3

## 2018-11-02 MED ORDER — CHLORHEXIDINE GLUCONATE 0.12% ORAL RINSE (MEDLINE KIT): 15.0000 mL | Freq: Two times a day (BID) | OROMUCOSAL | Status: DC

## 2018-11-02 MED ORDER — BUDESONIDE 0.5 MG/2ML IN SUSP
0.5000 mg | Freq: Two times a day (BID) | RESPIRATORY_TRACT | Status: DC
  Administered 2018-11-02 – 2018-11-07 (×10): 0.5 mg via RESPIRATORY_TRACT
  Filled 2018-11-02 (×12): qty 2

## 2018-11-02 NOTE — Progress Notes (Signed)
Called RT per MD to draw abg and place pt on bipap  RT aware

## 2018-11-02 NOTE — Progress Notes (Signed)
MD aware of blood gas

## 2018-11-02 NOTE — Progress Notes (Signed)
PROGRESS NOTE    Julie Benjamin   XTG:[id number] DOB: 1970/01/18 DOA: 10/31/2018  Admitted from: Home PCP: Patient, No Pcp Per   Hospital Summary  A 48 year old female with past medical history of asthma, diabetes, OSA on CPAP, hypertension, morbid obesity, DVT, who presented to the ED with progressively worsening shortness of breath, initially on exertion now at rest and admitted for CHF exacerbation as well as right upper lobe pneumonia.  In the ED patient was tachycardic, hypoxic, tachypneic.  She had chest x-ray which showed right upper lobe consolidation concerning for pneumonia, right pleural effusion, severe cardiomegaly and pulmonary vascular congestion.  She had elevated creatinine 1.45 and BNP 932.  She was started on IV Lasix, albuterol, Atrovent and received 125 mg IV Solu-Medrol in the emergency department.  11/1: Transferred to ICU for acute hypercarbic respiratory failure in setting of OHS and possible fluid overload and placed on BiPAP.  A & P   Principal Problem:   Acute respiratory failure with hypoxia (HCC) Active Problems:   Hypertension   Diabetes mellitus without complication (HCC)   Asthma   Right upper lobe pneumonia   Renal insufficiency   History of DVT of lower extremity   Acute hypercarbic respiratory failure in setting of OHS and suspected fluid overload  pH 7.2,PCO2 95,PO2 98,bicarb 39.  See prior note from today for further detail . ICU transfer for high risk of intubation . Continue Lasix . Hold sedating meds . Pulmicort and DuoNeb . N.p.o.  Suspected new onset heart failure  Weight the same.  Poor measured output overnight, volume up.  Chest x-ray improved . Follow-up echo . Continue Lasix  Right upper lobe consolidation Afebrile, increased WBC in setting of steroids, procalcitonin negative, interval improvement on chest x-ray.  Unlikely CAP.  Completed 2 days azithromycin/ceftriaxone . Agree with discontinuing antibiotics . Monitor for  aspiration pneumonia in setting of above  Asthma exacerbation Received nebulizer treatment prior to my arrival.  No wheeze . Continue plan as above  OSA Did not get CPAP overnight which led to respiratory failure as above . Continue on BiPAP  Elevated creatinine improved  Hypertension poorly controlled on amlodipine 10 mg, improved with hydralazine this a.m. . Consider ACE inhibitor and beta-blocker in setting of suspected heart failure once renal function and volume status improved  Diabetes HA1C 8.6.  Currently stable . Continue sliding scale . Needs PCP at discharge  DVT prophylaxis: Lovenox   Code Status: Full Code  Diet: P.o. Family Communication: Patient's daughter has been updated over the phone Disposition Plan: Clinical stability  Consultants  . PCCM  Procedures  . BiPAP 11/1  Antibiotics  Completed 2 days azithromycin/ceftriaxone.  Discontinued 11/1    Subjective   Unable to provide history, patient altered with abdominal breathing.  See my previous note from today for further detail  Objective   Vitals:   11/02/18 1145 11/02/18 1237 11/02/18 1247 11/02/18 1300  BP: (!) 153/98 (!) 131/95    Pulse: (!) 105  94   Resp: (!) 28  17   Temp:      TempSrc:      SpO2: 100%   100%  Weight:      Height:        Intake/Output Summary (Last 24 hours) at 11/02/2018 1428 Last data filed at 11/02/2018 1320 Gross per 24 hour  Intake 343 ml  Output 2300 ml  Net -1957 ml   Filed Weights   10/31/18 1736 11/02/18 1119  Weight: 135.6 kg 136  kg    Examination:  Physical Exam Vitals signs and nursing note reviewed.  Constitutional:      General: She is in acute distress.     Appearance: She is obese.     Comments: Arousable to painful stimuli only and falls back asleep    HENT:     Head: Normocephalic.     Mouth/Throat:     Comments: Dry Cardiovascular:     Rate and Rhythm: Regular rhythm. Tachycardia present.  Pulmonary:     Effort: Accessory  muscle usage and respiratory distress present.     Breath sounds: No stridor. Examination of the right-lower field reveals rales. Rales present. No wheezing.     Comments: Abdominal breathing and accessory muscle use Mouth breathing Musculoskeletal:     Comments: 2+ bilateral lower extremity edema  Neurological:     Mental Status: She is disoriented.     Data Reviewed: I have personally reviewed following labs and imaging studies  CBC: Recent Labs  Lab 10/31/18 1947 11/02/18 0506  WBC 10.7* 13.7*  NEUTROABS 9.0*  --   HGB 12.5 12.9  HCT 41.6 43.3  MCV 97.7 98.6  PLT 366 424*   Basic Metabolic Panel: Recent Labs  Lab 10/31/18 1947 11/01/18 0301 11/02/18 0506  NA 142  --  141  K 3.4*  --  4.0  CL 101  --  100  CO2 31  --  30  GLUCOSE 120*  --  124*  BUN 21*  --  26*  CREATININE 1.45*  --  1.26*  CALCIUM 8.9  --  8.9  MG  --  1.8 2.0   GFR: Estimated Creatinine Clearance: 74.8 mL/min (A) (by C-G formula based on SCr of 1.26 mg/dL (H)). Liver Function Tests: Recent Labs  Lab 10/31/18 1947  AST 13*  ALT 23  ALKPHOS 72  BILITOT 0.5  PROT 6.9  ALBUMIN 3.3*   No results for input(s): LIPASE, AMYLASE in the last 168 hours. No results for input(s): AMMONIA in the last 168 hours. Coagulation Profile: No results for input(s): INR, PROTIME in the last 168 hours. Cardiac Enzymes: No results for input(s): CKTOTAL, CKMB, CKMBINDEX, TROPONINI in the last 168 hours. BNP (last 3 results) No results for input(s): PROBNP in the last 8760 hours. HbA1C: Recent Labs    11/01/18 0301  HGBA1C 8.5*   CBG: Recent Labs  Lab 11/01/18 1156 11/01/18 1614 11/01/18 2142 11/02/18 0544 11/02/18 1121  GLUCAP 162* 199* 167* 138* 149*   Lipid Profile: No results for input(s): CHOL, HDL, LDLCALC, TRIG, CHOLHDL, LDLDIRECT in the last 72 hours. Thyroid Function Tests: No results for input(s): TSH, T4TOTAL, FREET4, T3FREE, THYROIDAB in the last 72 hours. Anemia Panel: No  results for input(s): VITAMINB12, FOLATE, FERRITIN, TIBC, IRON, RETICCTPCT in the last 72 hours. Sepsis Labs: Recent Labs  Lab 11/01/18 0301 11/02/18 0506  PROCALCITON <0.10 <0.10    Recent Results (from the past 240 hour(s))  SARS CORONAVIRUS 2 (TAT 6-24 HRS) Nasopharyngeal Nasopharyngeal Swab     Status: None   Collection Time: 11/01/18  1:56 AM   Specimen: Nasopharyngeal Swab  Result Value Ref Range Status   SARS Coronavirus 2 NEGATIVE NEGATIVE Final    Comment: (NOTE) SARS-CoV-2 target nucleic acids are NOT DETECTED. The SARS-CoV-2 RNA is generally detectable in upper and lower respiratory specimens during the acute phase of infection. Negative results do not preclude SARS-CoV-2 infection, do not rule out co-infections with other pathogens, and should not be used as the sole  basis for treatment or other patient management decisions. Negative results must be combined with clinical observations, patient history, and epidemiological information. The expected result is Negative. Fact Sheet for Patients: SugarRoll.be Fact Sheet for Healthcare Providers: https://www.woods-mathews.com/ This test is not yet approved or cleared by the [place] FDA and  has been authorized for detection and/or diagnosis of SARS-CoV-2 by FDA under an Emergency Use Authorization (EUA). This EUA will remain  in effect (meaning this test can be used) for the duration of the COVID-19 declaration under Section 56 4(b)(1) of the Act, 21 U.S.C. section 360bbb-3(b)(1), unless the authorization is terminated or revoked sooner. Performed at [facility], [facility] [address], [place], [place] [address]          Radiology Studies: Dg Chest 2 View  Result Date: 10/31/2018 CLINICAL DATA:  Progressive shortness of breath over several days. Asthma. EXAM: CHEST - 2 VIEW COMPARISON:  None. FINDINGS: Severe cardiomegaly is demonstrated, as well as pulmonary  vascular congestion. Ill-defined area of airspace opacity is seen in the right upper lobe, suspicious for pneumonia. Tiny right pleural effusion is also seen. Several old bilateral rib fracture deformities are noted. IMPRESSION: Right upper lobe airspace opacity, suspicious for pneumonia. Recommend chest radiographic follow-up to confirm resolution. Tiny right pleural effusion. Severe cardiomegaly with pulmonary vascular congestion. Electronically Signed   By: Marlaine Hind M.D.   On: 10/31/2018 18:50   Dg Chest Port 1 View  Result Date: 11/02/2018 CLINICAL DATA:  Shortness of breath. EXAM: PORTABLE CHEST 1 VIEW COMPARISON:  Chest x-ray dated 10/31/2018. FINDINGS: Improved aeration within the RIGHT upper lobe suggesting a resolving pneumonia or resolving asymmetric pulmonary edema. Lungs otherwise clear, although the LEFT lower lung is obscured by the overlying heart shadow. Stable marked cardiomegaly. No acute appearing osseous abnormality. IMPRESSION: 1. Improved aeration within the RIGHT upper lobe suggesting resolving pneumonia or resolving edema. 2. Stable marked cardiomegaly. No evidence of associated CHF at this time. Electronically Signed   By: Franki Cabot M.D.   On: 11/02/2018 13:27        Scheduled Meds: . amLODipine  10 mg Oral Daily  . budesonide (PULMICORT) nebulizer solution  0.5 mg Nebulization BID  . Chlorhexidine Gluconate Cloth  6 each Topical Daily  . enoxaparin (LOVENOX) injection  40 mg Subcutaneous Q24H  . furosemide  40 mg Intravenous Q12H  . insulin aspart  0-9 Units Subcutaneous TID WC  . ipratropium-albuterol  3 mL Nebulization Q6H  . montelukast  10 mg Oral QHS  . potassium chloride  20 mEq Oral Daily  . sodium chloride flush  3 mL Intravenous Q12H   Continuous Infusions: . sodium chloride 250 mL (11/02/18 1307)  . famotidine (PEPCID) IV 20 mg (11/02/18 1308)     LOS: 1 day    Time spent: 27 minutes    Harold Hedge, DO [facility] Pager  [phone]  If 7PM-7AM, please contact night-coverage [email or web address] Password [email or web address] 11/02/2018, 2:28 PM

## 2018-11-02 NOTE — Progress Notes (Signed)
Pt in no distress at this time to require bipap.  Pt states she is hoping to stay off machine for the night.  RT will relay msg to unit RT when available.  RT will continue to monitor.

## 2018-11-02 NOTE — Progress Notes (Signed)
Pt reports shortness of breath  Pt sitting at edge of bed  Assessment and vitals completed Informed MD, MD aware stated okay to give nebulizer treatment  Lasix given early as well  Pt tolerated nebulizer treatment well  Pt states she feels better  Placed BSC next to bed  Educated pt to call when needing to ambulate  Will continue to monitor

## 2018-11-02 NOTE — Progress Notes (Signed)
Patient woke back up Fine to start diet Use BIPAP at night and PRN during day  Erskine Emery MD

## 2018-11-02 NOTE — Consult Note (Signed)
NAME:  Julie Benjamin, MRN:  [id number], DOB:  09-Aug-1970, LOS: 1 ADMISSION DATE:  10/31/2018, CONSULTATION DATE:  11/02/18 REFERRING MD:  Dairl Ponder, CHIEF COMPLAINT:  Confusion   Brief History   48 year old woman with hx of metabolic syndrome, asthma presenting with multifactorial respiratory failure.  History of present illness   48 year old woman with history of metabolic syndrome, asthma presented 10/31 for increasing SOB found to have RUL infiltrate.  Admitted for tx of PNA and fluid overload.  Today was more SOB and confused so pulmonology was consulted.  Patient able to state name but that's about it.  History is therefore per chart review.  Past Medical History   Past Medical History:  Diagnosis Date  . Asthma   . Diabetes mellitus without complication (HCC)   . Hypertension      Significant Hospital Events   10/31 admitted  Consults:  PCCM  Procedures:  NA  Significant Diagnostic Tests:  CXR admission RUL infiltrate CXR today infiltrate is not visible BNP 931 Cr 1.5 admission, 1.2 today HCO3 30 900cc out HIV neg ABG pending  Micro Data:  SARS2 neg I do not see any blood or respiratory cultures  Antimicrobials:  Ceftiaxone/azithromycin >>  Interim history/subjective:  Consulted  Objective   Blood pressure (!) 158/127, pulse (!) 118, temperature 97.8 F (36.6 C), temperature source Oral, resp. rate 20, height 5\' 3"  (1.6 m), weight 136 kg, SpO2 100 %.        Intake/Output Summary (Last 24 hours) at 11/02/2018 1140 Last data filed at 11/02/2018 0741 Gross per 24 hour  Intake 623 ml  Output 1550 ml  Net -927 ml   Filed Weights   10/31/18 1736 11/02/18 1119  Weight: 135.6 kg 136 kg    Examination: GEN: obese woman somnolent HEENT: [place] 4 CV: RRR, ext warm PULM: Diminished bases, poor inspiratory effort GI: Soft, +BS EXT: Chronic lymphedema NEURO: Moves all 4 ext to command PSYCH: RASS -1, AO to self SKIN: No rashes   Resolved Hospital  Problem list   NA  Assessment & Plan:  # Acute hypercarbic respiratory failure in setting of OHS and possible fluid overload # Underlying asthma no obvious bronchospasm # Pct neg, doubt infectious component  - Fine to continue lasix - NPO - Hold sedating meds - BIPAP - Pulmicort/duonebs - DC antibiotics - Transfer to ICU, high risk for intubation  Best practice:  Diet: NPO Pain/Anxiety/Delirium protocol (if indicated): NA VAP protocol (if indicated): NA DVT prophylaxis: lovenox GI prophylaxis: pepcid Glucose control: SSI Mobility: BR Code Status: Full Family Communication: Will reach out Disposition:   Labs   CBC: Recent Labs  Lab 10/31/18 1947 11/02/18 0506  WBC 10.7* 13.7*  NEUTROABS 9.0*  --   HGB 12.5 12.9  HCT 41.6 43.3  MCV 97.7 98.6  PLT 366 424*    Basic Metabolic Panel: Recent Labs  Lab 10/31/18 1947 11/01/18 0301 11/02/18 0506  NA 142  --  141  K 3.4*  --  4.0  CL 101  --  100  CO2 31  --  30  GLUCOSE 120*  --  124*  BUN 21*  --  26*  CREATININE 1.45*  --  1.26*  CALCIUM 8.9  --  8.9  MG  --  1.8 2.0   GFR: Estimated Creatinine Clearance: 74.8 mL/min (A) (by C-G formula based on SCr of 1.26 mg/dL (H)). Recent Labs  Lab 10/31/18 1947 11/01/18 0301 11/02/18 0506  PROCALCITON  --  <  0.10 <0.10  WBC 10.7*  --  13.7*    Liver Function Tests: Recent Labs  Lab 10/31/18 1947  AST 13*  ALT 23  ALKPHOS 72  BILITOT 0.5  PROT 6.9  ALBUMIN 3.3*   No results for input(s): LIPASE, AMYLASE in the last 168 hours. No results for input(s): AMMONIA in the last 168 hours.  ABG No results found for: PHART, PCO2ART, PO2ART, HCO3, TCO2, ACIDBASEDEF, O2SAT   Coagulation Profile: No results for input(s): INR, PROTIME in the last 168 hours.  Cardiac Enzymes: No results for input(s): CKTOTAL, CKMB, CKMBINDEX, TROPONINI in the last 168 hours.  HbA1C: Hgb A1c MFr Bld  Date/Time Value Ref Range Status  11/01/2018 03:01 AM 8.5 (H) 4.8 - 5.6 %  Final    Comment:    (NOTE) Pre diabetes:          5.7%-6.4% Diabetes:              >6.4% Glycemic control for   <7.0% adults with diabetes     CBG: Recent Labs  Lab 11/01/18 1156 11/01/18 1614 11/01/18 2142 11/02/18 0544 11/02/18 1121  GLUCAP 162* 199* 167* 138* 149*    Review of Systems:   Cannot assess due to encephalopathy  Past Medical History  She,  has a past medical history of Asthma, Diabetes mellitus without complication ([place]), and Hypertension.   Surgical History   History reviewed. No pertinent surgical history.   Social History      Family History   Her family history is not on file.   Allergies No Known Allergies   Home Medications  Prior to Admission medications   Medication Sig Start Date End Date Taking? Authorizing Provider  albuterol (PROVENTIL) (2.5 MG/3ML) 0.083% nebulizer solution Take 2.5 mg by nebulization every 6 (six) hours as needed for wheezing or shortness of breath.   Yes [provider]  albuterol (VENTOLIN HFA) 108 (90 Base) MCG/ACT inhaler Inhale 1-2 puffs into the lungs every 6 (six) hours as needed for wheezing or shortness of breath.   Yes [provider]  amLODipine (NORVASC) 10 MG tablet Take 10 mg by mouth daily.   Yes [provider]  budesonide-formoterol (SYMBICORT) 160-4.5 MCG/ACT inhaler Inhale 2 puffs into the lungs 2 (two) times daily.   Yes [provider]  ipratropium-albuterol (DUONEB) 0.5-2.5 (3) MG/3ML SOLN Take 3 mLs by nebulization every 6 (six) hours as needed (for breathing).   Yes [provider]  montelukast (SINGULAIR) 10 MG tablet Take 10 mg by mouth at bedtime.   Yes [provider]     Critical care time: 31 minutes

## 2018-11-02 NOTE — Progress Notes (Signed)
Report called to [place], spoke to [facility], RN Pt transferred in bed with RRT, RT and RN  Pt has all belongings Quentin Angst, daughter to inform of transfer, Kathlynn Grate aware pt is now on 2H06

## 2018-11-02 NOTE — Progress Notes (Signed)
Paged MD regarding blood gas  Awaiting call back

## 2018-11-02 NOTE — Significant Event (Signed)
Rapid Response Event Note  Overview: Time Called: 1140 Arrival Time: 1140 Event Type: Respiratory  Initial Focused Assessment: Patient alert and oriented this am.  Earlier this am she had an episode of acute respiratory distress, sitting up tripoding. Resp tx and lasix given.  After that episode she was up in room walking in room.  Upon my arrival she is very lethargic lying in the bed.  She can arouse but only briefly.  BP 153/98  ST 105  RR 28  O2 sat 100%  Interventions: ABG done Placed on Bipap ABG result 7.23/95/98/39 Dr Neysa Bonito notified of results CCM consulted  Transferred to 2H06 via bed with O2 via [place] (if not transferred):  Event Summary: Name of Physician Notified: Marva Panda at    Name of Consulting Physician Notified: Dr Smith/Dr Valeta Harms at    Outcome: Transferred (Comment)  Event End Time: [place]  Raliegh Ip

## 2018-11-02 NOTE — Significant Event (Signed)
Upon my arrival to bedside for general exam patient was noted to have abdominal breathing arousable only briefly to painful stimuli.  Nurse arrived at bedside and agree sat the patient upright.  Vitals taken shown to be hypertensive, tachycardic and satting well on nasal cannula.  Patient was not on CPAP.  PE: General: Disoriented, arousable to painful stimuli Heart: Tachycardic Lungs: Right lower lobe rales, no wheeze, abdominal breathing   Suspect obesity hypoventilation and CO2 retention.  Improvement in mental status with sitting upright.  Still altered. Personal read of chest x-ray shows some interval improvement from yesterday still with volume overload.  Final read pending - Stat ABG ordered - BiPAP ordered - Transfer to progressive unit - Pulmonary: Aware and arrived at bedside  Full address note to follow  Thank you, Marva Panda, DO [facility] Pager: [phone]

## 2018-11-02 NOTE — Progress Notes (Signed)
RT obtained ABG with critical results from lab.   PH: 7.23 CO2: 95 PaO2: 98 Bicarb: 39  Patient now on bipap waiting on bed placement. Rapid response RN at bedside.

## 2018-11-03 DIAGNOSIS — J9612 Chronic respiratory failure with hypercapnia: Secondary | ICD-10-CM

## 2018-11-03 DIAGNOSIS — J45909 Unspecified asthma, uncomplicated: Secondary | ICD-10-CM

## 2018-11-03 DIAGNOSIS — J9611 Chronic respiratory failure with hypoxia: Secondary | ICD-10-CM

## 2018-11-03 LAB — GLUCOSE, CAPILLARY
Glucose-Capillary: 149 mg/dL — ABNORMAL HIGH (ref 70–99)
Glucose-Capillary: 211 mg/dL — ABNORMAL HIGH (ref 70–99)
Glucose-Capillary: 136 mg/dL — ABNORMAL HIGH (ref 70–99)
Glucose-Capillary: 94 mg/dL (ref 70–99)
Glucose-Capillary: 163 mg/dL — ABNORMAL HIGH (ref 70–99)

## 2018-11-03 LAB — BASIC METABOLIC PANEL
Anion gap: 8 (ref 5–15)
BUN: 20 mg/dL (ref 6–20)
CO2: 39 mmol/L — ABNORMAL HIGH (ref 22–32)
Calcium: 8.5 mg/dL — ABNORMAL LOW (ref 8.9–10.3)
Chloride: 96 mmol/L — ABNORMAL LOW (ref 98–111)
Creatinine, Ser: 1.31 mg/dL — ABNORMAL HIGH (ref 0.44–1.00)
GFR calc Af Amer: 56 mL/min — ABNORMAL LOW (ref >60)
GFR calc non Af Amer: 48 mL/min — ABNORMAL LOW (ref >60)
Glucose, Bld: 154 mg/dL — ABNORMAL HIGH (ref 70–99)
Potassium: 3.2 mmol/L — ABNORMAL LOW (ref 3.5–5.1)
Sodium: 143 mmol/L (ref 135–145)

## 2018-11-03 LAB — CBC
HCT: 40.5 % (ref 36.0–46.0)
Hemoglobin: 12 g/dL (ref 12.0–15.0)
MCH: 29.1 pg (ref 26.0–34.0)
MCHC: 29.6 g/dL — ABNORMAL LOW (ref 30.0–36.0)
MCV: 98.1 fL (ref 80.0–100.0)
Platelets: 383 10*3/uL (ref 150–400)
RBC: 4.13 MIL/uL (ref 3.87–5.11)
RDW: 15.7 % — ABNORMAL HIGH (ref 11.5–15.5)
WBC: 10.7 10*3/uL — ABNORMAL HIGH (ref 4.0–10.5)
nRBC: 0 % (ref 0.0–0.2)

## 2018-11-03 MED ORDER — HYDRALAZINE HCL 10 MG PO TABS
10.0000 mg | ORAL_TABLET | Freq: Three times a day (TID) | ORAL | Status: DC
  Administered 2018-11-03 – 2018-11-04 (×4): 10 mg via ORAL
  Filled 2018-11-03 (×4): qty 1

## 2018-11-03 MED ORDER — POTASSIUM CHLORIDE CRYS ER 20 MEQ PO TBCR
30.0000 meq | EXTENDED_RELEASE_TABLET | ORAL | Status: AC
  Administered 2018-11-03 (×2): 30 meq via ORAL
  Filled 2018-11-03 (×2): qty 1

## 2018-11-03 MED ORDER — IPRATROPIUM-ALBUTEROL 0.5-2.5 (3) MG/3ML IN SOLN
3.0000 mL | Freq: Three times a day (TID) | RESPIRATORY_TRACT | Status: DC
  Administered 2018-11-04 – 2018-11-05 (×4): 3 mL via RESPIRATORY_TRACT
  Filled 2018-11-03 (×6): qty 3

## 2018-11-03 MED ORDER — HYDRALAZINE HCL 20 MG/ML IJ SOLN
10.0000 mg | Freq: Four times a day (QID) | INTRAMUSCULAR | Status: DC | PRN
  Administered 2018-11-03: 20:00:00 10 mg via INTRAVENOUS
  Filled 2018-11-03 (×3): qty 1

## 2018-11-03 NOTE — Progress Notes (Signed)
Pt states she would like to wait until much later to go on BIPAP. She feels like she may not need it at all tonight. RT will monitor for BIPAP need. Pt respiratory status currently stable O2 100% on [place] 2 LPM. RT will continue to monitor.

## 2018-11-03 NOTE — Progress Notes (Signed)
RT found pt on BiPAP at 1336. RN was called and told RT that pt went on the BiPAP around 1300. Pt tolerating well at this time. RT will continue to monitor.

## 2018-11-03 NOTE — Progress Notes (Addendum)
NAME:  Julie Benjamin, MRN:  [id number], DOB:  October 04, 1970, LOS: 2 ADMISSION DATE:  10/31/2018, CONSULTATION DATE:  11/02/18 REFERRING MD:  Neysa Bonito, CHIEF COMPLAINT:  Confusion   Brief History   48 year old woman with hx of metabolic syndrome, asthma presenting with multifactorial respiratory failure.  History of present illness   48 year old woman with history of metabolic syndrome, asthma presented 10/31 for increasing SOB found to have RUL infiltrate.  Admitted for tx of PNA and fluid overload.  Today was more SOB and confused so pulmonology was consulted.  Patient able to state name but that's about it.  History is therefore per chart review.  Past Medical History   Past Medical History:  Diagnosis Date  . Asthma   . Diabetes mellitus without complication ([place])   . Hypertension    Significant Hospital Events   10/31 admitted  Consults:  PCCM  Procedures:  NA  Significant Diagnostic Tests:  ABG 11/02/18 - 7.239/95/98  Micro Data:  SARS2 neg  Antimicrobials:  Ceftiaxone/azithromycin >>  Interim history/subjective:  Reports feeling sleepy. Did not wear BiPAP overnight.   Objective   Blood pressure (!) 190/133, pulse (!) 120, temperature 97.9 F (36.6 C), temperature source Oral, resp. rate (!) 22, height 5\' 3"  (1.6 m), weight 134.3 kg, SpO2 97 %.    FiO2 (%):  [30 %] 30 %   Intake/Output Summary (Last 24 hours) at 11/03/2018 0937 Last data filed at 11/03/2018 0800 Gross per 24 hour  Intake 290.57 ml  Output 3500 ml  Net -3209.43 ml   Filed Weights   10/31/18 1736 11/02/18 1119 11/03/18 0500  Weight: 135.6 kg 136 kg 134.3 kg   Physical Exam: General: BMI 52. Well-appearing, no acute distress, mild drowsiness HENT: [place], AT, OP clear, MMM Eyes: EOMI, no scleral icterus Respiratory: Diminished breath sounds bilaterally. No crackles, wheezing or rales Cardiovascular: RRR, -M/R/G, no JVD GI: BS+, soft, nontender Extremities:-Edema,-tenderness Neuro: Mild  drowsiness, AAOx4 Skin: Intact, no rashes or bruising Psych: Normal mood, normal affect GU: Foley in place  Resolved Hospital Problem list   NA  Assessment & Plan:   #Acute hypercarbic respiratory failure in setting of OHS and possible fluid overload #Underlying asthma no obvious bronchospasm #Pct neg, doubt infectious component - BiPAP HS and when sleeping - Continue lasix - NPO - Hold sedating meds - Pulmicort/duonebs - Remain in ICU for high risk of intubation  #Hypertension - Start scheduled hydralazine - PRN IV hydralazine  #Hypokalemia - Replete  Best practice:  Diet: NPO Pain/Anxiety/Delirium protocol (if indicated): NA VAP protocol (if indicated): NA DVT prophylaxis: lovenox GI prophylaxis: pepcid Glucose control: SSI Mobility: BR Code Status: Full Family Communication: Updated patient 11/2 Disposition:   Labs   CBC: Recent Labs  Lab 10/31/18 1947 11/02/18 0506 11/03/18 0259  WBC 10.7* 13.7* 10.7*  NEUTROABS 9.0*  --   --   HGB 12.5 12.9 12.0  HCT 41.6 43.3 40.5  MCV 97.7 98.6 98.1  PLT 366 424* 237    Basic Metabolic Panel: Recent Labs  Lab 10/31/18 1947 11/01/18 0301 11/02/18 0506 11/03/18 0259  NA 142  --  141 143  K 3.4*  --  4.0 3.2*  CL 101  --  100 96*  CO2 31  --  30 39*  GLUCOSE 120*  --  124* 154*  BUN 21*  --  26* 20  CREATININE 1.45*  --  1.26* 1.31*  CALCIUM 8.9  --  8.9 8.5*  MG  --  1.8 2.0  --    GFR: Estimated Creatinine Clearance: 71.4 mL/min (A) (by C-G formula based on SCr of 1.31 mg/dL (H)). Recent Labs  Lab 10/31/18 1947 11/01/18 0301 11/02/18 0506 11/03/18 0259  PROCALCITON  --  <0.10 <0.10  --   WBC 10.7*  --  13.7* 10.7*    Liver Function Tests: Recent Labs  Lab 10/31/18 1947  AST 13*  ALT 23  ALKPHOS 72  BILITOT 0.5  PROT 6.9  ALBUMIN 3.3*   No results for input(s): LIPASE, AMYLASE in the last 168 hours. No results for input(s): AMMONIA in the last 168 hours.  ABG    Component Value  Date/Time   PHART 7.239 (L) 11/02/2018 1130   PCO2ART 95.0 (HH) 11/02/2018 1130   PO2ART 98.5 11/02/2018 1130   HCO3 39.2 (H) 11/02/2018 1130   O2SAT 96.5 11/02/2018 1130     Coagulation Profile: No results for input(s): INR, PROTIME in the last 168 hours.  Cardiac Enzymes: No results for input(s): CKTOTAL, CKMB, CKMBINDEX, TROPONINI in the last 168 hours.  HbA1C: Hgb A1c MFr Bld  Date/Time Value Ref Range Status  11/01/2018 03:01 AM 8.5 (H) 4.8 - 5.6 % Final    Comment:    (NOTE) Pre diabetes:          5.7%-6.4% Diabetes:              >6.4% Glycemic control for   <7.0% adults with diabetes     CBG: Recent Labs  Lab 11/02/18 1121 11/02/18 1553 11/02/18 2130 11/03/18 0636 11/03/18 0856  GLUCAP 149* 69* 153* 149* 211*    Critical care time: 35 minutes    The patient is critically ill with multiple organ systems failure and requires high complexity decision making for assessment and support, frequent evaluation and titration of therapies, application of advanced monitoring technologies and extensive interpretation of multiple databases.   Critical Care Time devoted to patient care services described in this note is 35 Minutes.   Mechele Collin, M.D. [facility] Pulmonary/Critical Care Medicine 11/03/2018 9:37 AM  Pager: [phone] After hours pager: [phone]

## 2018-11-03 NOTE — Progress Notes (Signed)
RT offered to place pt on BIPAP V60 for the night and pt agreed to placement at this time. Pt placed on 16/6 BUR 20 FIO2 30%. Pt respiratory status is stable at this time on BIPAP. RT will continue to monitor.

## 2018-11-03 NOTE — Progress Notes (Signed)
Pt currently off Bi-PAP, machine at bedside. Pt on 2L [place] with 100% saturation. RT will continue to monitor.

## 2018-11-04 ENCOUNTER — Encounter (HOSPITAL_COMMUNITY): Payer: Self-pay | Admitting: General Practice

## 2018-11-04 DIAGNOSIS — I10 Essential (primary) hypertension: Secondary | ICD-10-CM

## 2018-11-04 DIAGNOSIS — J9601 Acute respiratory failure with hypoxia: Secondary | ICD-10-CM

## 2018-11-04 DIAGNOSIS — I429 Cardiomyopathy, unspecified: Secondary | ICD-10-CM

## 2018-11-04 DIAGNOSIS — I5041 Acute combined systolic (congestive) and diastolic (congestive) heart failure: Secondary | ICD-10-CM

## 2018-11-04 LAB — BASIC METABOLIC PANEL
Anion gap: 12 (ref 5–15)
BUN: 17 mg/dL (ref 6–20)
CO2: 38 mmol/L — ABNORMAL HIGH (ref 22–32)
Calcium: 9.2 mg/dL (ref 8.9–10.3)
Chloride: 93 mmol/L — ABNORMAL LOW (ref 98–111)
Creatinine, Ser: 1.1 mg/dL — ABNORMAL HIGH (ref 0.44–1.00)
GFR calc Af Amer: >60 mL/min (ref >60)
GFR calc non Af Amer: 60 mL/min — ABNORMAL LOW (ref >60)
Glucose, Bld: 109 mg/dL — ABNORMAL HIGH (ref 70–99)
Potassium: 3.8 mmol/L (ref 3.5–5.1)
Sodium: 143 mmol/L (ref 135–145)

## 2018-11-04 LAB — CBC
HCT: 43.3 % (ref 36.0–46.0)
Hemoglobin: 12.9 g/dL (ref 12.0–15.0)
MCH: 29 pg (ref 26.0–34.0)
MCHC: 29.8 g/dL — ABNORMAL LOW (ref 30.0–36.0)
MCV: 97.3 fL (ref 80.0–100.0)
Platelets: 374 10*3/uL (ref 150–400)
RBC: 4.45 MIL/uL (ref 3.87–5.11)
RDW: 15.5 % (ref 11.5–15.5)
WBC: 10 10*3/uL (ref 4.0–10.5)
nRBC: 0 % (ref 0.0–0.2)

## 2018-11-04 LAB — GLUCOSE, CAPILLARY
Glucose-Capillary: 113 mg/dL — ABNORMAL HIGH (ref 70–99)
Glucose-Capillary: 114 mg/dL — ABNORMAL HIGH (ref 70–99)
Glucose-Capillary: 140 mg/dL — ABNORMAL HIGH (ref 70–99)
Glucose-Capillary: 172 mg/dL — ABNORMAL HIGH (ref 70–99)

## 2018-11-04 MED ORDER — PNEUMOCOCCAL VAC POLYVALENT 25 MCG/0.5ML IJ INJ
0.5000 mL | INJECTION | INTRAMUSCULAR | Status: AC
  Administered 2018-11-05: 09:00:00 0.5 mL via INTRAMUSCULAR
  Filled 2018-11-04: qty 0.5

## 2018-11-04 MED ORDER — CARVEDILOL 12.5 MG PO TABS
12.5000 mg | ORAL_TABLET | Freq: Two times a day (BID) | ORAL | Status: DC
  Administered 2018-11-04: 18:00:00 12.5 mg via ORAL
  Filled 2018-11-04: qty 1

## 2018-11-04 MED ORDER — FUROSEMIDE 10 MG/ML IJ SOLN
40.0000 mg | Freq: Two times a day (BID) | INTRAMUSCULAR | Status: DC
  Administered 2018-11-04: 18:00:00 40 mg via INTRAVENOUS
  Filled 2018-11-04: qty 4

## 2018-11-04 MED ORDER — ISOSORB DINITRATE-HYDRALAZINE 20-37.5 MG PO TABS
1.0000 | ORAL_TABLET | Freq: Three times a day (TID) | ORAL | Status: DC
  Administered 2018-11-04: 1 via ORAL
  Filled 2018-11-04 (×2): qty 1

## 2018-11-04 MED ORDER — SODIUM CHLORIDE 0.9 % IV SOLN: 250.0000 mL | INTRAVENOUS | Status: DC | PRN

## 2018-11-04 MED ORDER — SODIUM CHLORIDE 0.9% FLUSH
3.0000 mL | Freq: Two times a day (BID) | INTRAVENOUS | Status: DC
  Administered 2018-11-04 – 2018-11-06 (×4): 3 mL via INTRAVENOUS

## 2018-11-04 MED ORDER — ISOSORB DINITRATE-HYDRALAZINE 20-37.5 MG PO TABS: 1.0000 | ORAL_TABLET | Freq: Three times a day (TID) | ORAL | Status: DC

## 2018-11-04 MED ORDER — INFLUENZA VAC SPLIT QUAD 0.5 ML IM SUSY
0.5000 mL | PREFILLED_SYRINGE | INTRAMUSCULAR | Status: AC
  Administered 2018-11-05: 09:00:00 0.5 mL via INTRAMUSCULAR
  Filled 2018-11-04: qty 0.5

## 2018-11-04 MED ORDER — ASPIRIN 81 MG PO CHEW
81.0000 mg | CHEWABLE_TABLET | ORAL | Status: AC
  Administered 2018-11-05: 06:00:00 81 mg via ORAL
  Filled 2018-11-04: qty 1

## 2018-11-04 MED ORDER — SODIUM CHLORIDE 0.9% FLUSH: 3.0000 mL | INTRAVENOUS | Status: DC | PRN

## 2018-11-04 MED ORDER — SPIRONOLACTONE 12.5 MG HALF TABLET
12.5000 mg | ORAL_TABLET | Freq: Every day | ORAL | Status: DC
  Administered 2018-11-04: 18:00:00 12.5 mg via ORAL
  Filled 2018-11-04 (×2): qty 1

## 2018-11-04 MED ORDER — CARVEDILOL 12.5 MG PO TABS: 12.5000 mg | ORAL_TABLET | Freq: Two times a day (BID) | ORAL | Status: DC

## 2018-11-04 MED ORDER — FAMOTIDINE 20 MG PO TABS
20.0000 mg | ORAL_TABLET | Freq: Every day | ORAL | Status: DC
  Administered 2018-11-04 – 2018-11-07 (×4): 20 mg via ORAL
  Filled 2018-11-04 (×4): qty 1

## 2018-11-04 MED ORDER — FUROSEMIDE 10 MG/ML IJ SOLN: 40.0000 mg | Freq: Every day | INTRAMUSCULAR | Status: DC

## 2018-11-04 MED ORDER — SODIUM CHLORIDE 0.9 % IV SOLN
INTRAVENOUS | Status: DC
  Administered 2018-11-05: 06:00:00 via INTRAVENOUS

## 2018-11-04 MED ORDER — INSULIN ASPART 100 UNIT/ML ~~LOC~~ SOLN
0.0000 [IU] | Freq: Three times a day (TID) | SUBCUTANEOUS | Status: DC
  Administered 2018-11-05 – 2018-11-06 (×5): 1 [IU] via SUBCUTANEOUS

## 2018-11-04 NOTE — Consult Note (Signed)
Cardiology Consultation:   Patient ID: Julie Benjamin MRN: [id number]; DOB: November 25, 1970  Admit date: 10/31/2018 Date of Consult: 11/04/2018  Primary Care Provider: Patient, No Pcp Per Primary Cardiologist: Reatha HarpsWesley T O'Neal, MD New Primary Electrophysiologist:  None    Patient Profile:   Julie Benjamin is a 48 y.o. female with a hx of asthma, and was out of meds, DM, HTN, chronic bil. Lower est edema admitted with SOB and hypoxia who is being seen today for the evaluation of cardiomyopathy at the request of Dr. Everardo AllEllison.  History of Present Illness:   Julie Benjamin with recent move from [place] sept 9, 2020 and above hx and has not established with PCP here.  She has not had her meds filled in last couple of weeks.   She presented to ER 10/31/18 with SOB, orthopnea, and DOE. No recent wheezes.  This has occurred over last couple of weeks.  No colds or fevers. Though maybe a could a few weeks ago.  No chest pain, no history of CAD and no cath or echo prior to this admit.  No palpitations but recently with rapid HR.  Always sleeps with HOB raised due to breathing issues, had not been diagnosed with sleep apnea.   She presented because she was having some confusion at night. And SOB had increased more than she could contribute to asthma.  Some chest discomfort with the SOB.  Labs as below.    EKG:  The EKG was personally reviewed and demonstrates:  ST at 114, bi-atrial enlargement, poor R wave progression no acute ST changes and no old EKGs to compare. Telemetry:  Telemetry was personally reviewed and demonstrates:  ST at 110. 2V CXR 10/31/18 Right upper lobe airspace opacity, suspicious for pneumonia. Recommend chest radiographic follow-up to confirm resolution. Tiny right pleural effusion. Severe cardiomegaly with pulmonary vascular congestion. And PCXR follow up with improved aeration of Rt upper lobe -resolving PNA or edema.    LABS  Hs Troponin 85;81  BNP 931 COVID neg. Na 143, K+  3.8, Cr 1.10, Mg+ 1.8 WBC was 10.7, did have steroids in ER, today 10, Hgb 12.9, Plts 374. Strep pneumoniae urine neg.  Legionella neg  Echo 11/02/18 with EF 25-30%, severely dilated,  mildly increased LVH, G2 DD, LA severely dilated Rt atrial size moderately dilated.  Trace MR. RV size moderately enlarged with normal systolic function.   She was admitted and started on IV lasix, albuterol, and 125 mg IV solumedrol    Critical care was consulted 11/02/18 after transferred to ICU after pt was disoriented and hypoxic.  Acute hypercarbic respiratory failure in setting of OHS and possible fluid overload.  Lasix continued and BiPAP added, hold sedating meds.  Her ABX were stopped as well.  She was not given CPAP night before which may have contributed to respiratory issues.  Per pulmonary she should be discharged on BiPap.   Today transferred to floor.    I&O since admit neg 10,441 (5485 last 24 hours) and wt down from pk or 136 Kg to 130.7 kg she has rec'd total of 340 mg of lasix  At 40 mg BID.   On amlodipine 10 started on coreg but stopped due to asthma.  On hydralazine 10 mg po every 8 hours.    Currently BP 147/108 still with ST afebrile.  Feeling better, always has to sleep with head up and for first time in a long time head is at 30 degrees and she can rest.   No  pain.  Feeling better.   Heart Pathway Score:     Past Medical History:  Diagnosis Date  . Asthma   . Diabetes mellitus without complication (HCC)   . Hypertension   . Respiratory failure with hypoxia (HCC) 11/01/2018    Past Surgical History:  Procedure Laterality Date  . CESAREAN SECTION    . TUBAL LIGATION  2001     Home Medications:  Prior to Admission medications   Medication Sig Start Date End Date Taking? Authorizing Provider  albuterol (PROVENTIL) (2.5 MG/3ML) 0.083% nebulizer solution Take 2.5 mg by nebulization every 6 (six) hours as needed for wheezing or shortness of breath.   Yes [provider]   albuterol (VENTOLIN HFA) 108 (90 Base) MCG/ACT inhaler Inhale 1-2 puffs into the lungs every 6 (six) hours as needed for wheezing or shortness of breath.   Yes [provider]  amLODipine (NORVASC) 10 MG tablet Take 10 mg by mouth daily.   Yes [provider]  budesonide-formoterol (SYMBICORT) 160-4.5 MCG/ACT inhaler Inhale 2 puffs into the lungs 2 (two) times daily.   Yes [provider]  ipratropium-albuterol (DUONEB) 0.5-2.5 (3) MG/3ML SOLN Take 3 mLs by nebulization every 6 (six) hours as needed (for breathing).   Yes [provider]  montelukast (SINGULAIR) 10 MG tablet Take 10 mg by mouth at bedtime.   Yes [provider]    Inpatient Medications: Scheduled Meds: . amLODipine  10 mg Oral Daily  . budesonide (PULMICORT) nebulizer solution  0.5 mg Nebulization BID  . Chlorhexidine Gluconate Cloth  6 each Topical Daily  . enoxaparin (LOVENOX) injection  40 mg Subcutaneous Q24H  . famotidine  20 mg Oral Daily  . [START ON 11/05/2018] furosemide  40 mg Intravenous Daily  . hydrALAZINE  10 mg Oral Q8H  . insulin aspart  0-9 Units Subcutaneous TID WC  . ipratropium-albuterol  3 mL Nebulization TID  . mouth rinse  15 mL Mouth Rinse BID  . montelukast  10 mg Oral QHS  . sodium chloride flush  3 mL Intravenous Q12H   Continuous Infusions: . sodium chloride Stopped (11/03/18 1301)   PRN Meds: sodium chloride, acetaminophen **OR** acetaminophen, albuterol, hydrALAZINE, ondansetron **OR** ondansetron (ZOFRAN) IV, polyethylene glycol, sodium chloride flush  Allergies:   No Known Allergies  Social History:   Social History   Socioeconomic History  . Marital status: Single    Spouse name: Not on file  . Number of children: Not on file  . Years of education: Not on file  . Highest education level: Not on file  Occupational History  . Not on file  Social Needs  . Financial resource strain: Not on file  . Food insecurity    Worry: Not on  file    Inability: Not on file  . Transportation needs    Medical: Not on file    Non-medical: Not on file  Tobacco Use  . Smoking status: Never Smoker  . Smokeless tobacco: Never Used  Substance and Sexual Activity  . Alcohol use: Not on file  . Drug use: Not on file  . Sexual activity: Not on file  Lifestyle  . Physical activity    Days per week: Not on file    Minutes per session: Not on file  . Stress: Not on file  Relationships  . Social Musician on phone: Not on file    Gets together: Not on file    Attends religious service: Not on file  Active member of club or organization: Not on file    Attends meetings of clubs or organizations: Not on file    Relationship status: Not on file  . Intimate partner violence    Fear of current or ex partner: Not on file    Emotionally abused: Not on file    Physically abused: Not on file    Forced sexual activity: Not on file  Other Topics Concern  . Not on file  Social History Narrative  . Not on file    Family History:    Family History  Problem Relation Age of Onset  . Hypertension Mother   . COPD Mother   . Kidney failure Sister    no hx of CAD though her mother may have had before she died  ROS:  Please see the history of present illness.  General:no colds or fevers though possible a few weeks ago, no weight changes- prior to move to [place] she lost 20 lbs on a tea that suppressed appetite.  Skin:no rashes or ulcers HEENT:no blurred vision, no congestion CV:see HPI PUL:see HPI GI:no diarrhea constipation or melena, no indigestion GU:no hematuria, no dysuria MS:no joint pain, no claudication Neuro:no syncope, no lightheadedness, + some confusion prior to admit  Endo:+ diabetes, no thyroid disease  All other ROS reviewed and negative.     Physical Exam/Data:   Vitals:   11/04/18 0752 11/04/18 0815 11/04/18 1144 11/04/18 1342  BP:   (!) 147/108   Pulse: (!) 110  (!) 103   Resp:   20   Temp:   97.8  F (36.6 C)   TempSrc:   Oral   SpO2: 99% 96% 94% 96%  Weight:      Height:        Intake/Output Summary (Last 24 hours) at 11/04/2018 1558 Last data filed at 11/04/2018 1538 Gross per 24 hour  Intake 720 ml  Output 3800 ml  Net -3080 ml   Last 3 Weights 11/04/2018 11/04/2018 11/03/2018  Weight (lbs) 288 lb 3.2 oz 289 lb 14.5 oz 296 lb 1.2 oz  Weight (kg) 130.727 kg 131.5 kg 134.3 kg     Body mass index is 51.05 kg/m.  General:  Well nourished, well developed, obese,in no acute distress HEENT: normal Lymph: no adenopathy Neck: no JVD Endocrine:  No thryomegaly Vascular: No carotid bruits; pedal pulses 2+ bilaterally  Cardiac:  normal S1, S2; RRR; no murmur gallup rub or click, heart sounds muffled Lungs:  clear to diminished auscultation bilaterally, no wheezing, rhonchi or rales  Abd: obese, soft, nontender, no hepatomegaly  Ext: no edema Musculoskeletal:  No deformities, BUE and BLE strength normal and equal Skin: warm and dry  Neuro:  Alert and oriented X 3 MAE follows commands, no focal abnormalities noted Psych:  Normal affect    Relevant CV Studies: Echo 11/02/18  IMPRESSIONS    1. Left ventricular ejection fraction, by visual estimation, is 25 to 30%. The left ventricle has severely decreased function. There is mildly increased left ventricular hypertrophy.  2. Definity contrast agent was given IV to delineate the left ventricular endocardial borders.  3. Elevated left atrial pressure.  4. Left ventricular diastolic parameters are consistent with Grade II diastolic dysfunction (pseudonormalization).  5. Severely dilated left ventricular internal cavity size.  6. Global right ventricle has normal systolic function.The right ventricular size is moderately enlarged. No increase in right ventricular wall thickness.  7. Left atrial size was severely dilated.  8. Right atrial size was  moderately dilated.  9. The mitral valve is normal in structure. Trace mitral valve  regurgitation. 10. The tricuspid valve is not well visualized. Tricuspid valve regurgitation is not demonstrated. 11. The aortic valve is tricuspid. Aortic valve regurgitation is not visualized.  FINDINGS  Left Ventricle: Left ventricular ejection fraction, by visual estimation, is 25 to 30%. The left ventricle has severely decreased function. Definity contrast agent was given IV to delineate the left ventricular endocardial borders. The left ventricular  internal cavity size was severely dilated left ventricle. There is mildly increased left ventricular hypertrophy. Concentric left ventricular hypertrophy. Left ventricular diastolic parameters are consistent with Grade II diastolic dysfunction  (pseudonormalization). Elevated left atrial pressure.  Right Ventricle: The right ventricular size is moderately enlarged. No increase in right ventricular wall thickness. Global RV systolic function is has normal systolic function.  Left Atrium: Left atrial size was severely dilated.  Right Atrium: Right atrial size was moderately dilated  Pericardium: There is no evidence of pericardial effusion.  Mitral Valve: The mitral valve is normal in structure. Trace mitral valve regurgitation.  Tricuspid Valve: The tricuspid valve is not well visualized. Tricuspid valve regurgitation is not demonstrated.  Aortic Valve: The aortic valve is tricuspid. Aortic valve regurgitation is not visualized.  Pulmonic Valve: The pulmonic valve was not well visualized. Pulmonic valve regurgitation is not visualized.  Aorta: The aortic root and ascending aorta are structurally normal, with no evidence of dilitation.  IAS/Shunts: The interatrial septum was not well visualized.     LEFT VENTRICLE PLAX 2D LVIDd:         5.30 cm       Diastology LVIDs:         4.10 cm       LV e' lateral:   6.60 cm/s LV PW:         1.20 cm       LV E/e' lateral: 15.5 LV IVS:        1.20 cm       LV e' medial:    4.05  cm/s LVOT diam:     2.00 cm       LV E/e' medial:  25.2 LV SV:         61 ml LV SV Index:   23.99 LVOT Area:     3.14 cm   LV Volumes (MOD) LV area d, A2C:    51.60 cm LV area d, A4C:    46.30 cm LV area s, A2C:    41.60 cm LV area s, A4C:    36.30 cm LV major d, A2C:   9.39 cm LV major d, A4C:   9.28 cm LV major s, A2C:   8.80 cm LV major s, A4C:   8.02 cm LV vol d, MOD A2C: 228.0 ml LV vol d, MOD A4C: 188.0 ml LV vol s, MOD A2C: 157.0 ml LV vol s, MOD A4C: 134.0 ml LV SV MOD A2C:     71.0 ml LV SV MOD A4C:     188.0 ml LV SV MOD BP:      56.8 ml  RIGHT VENTRICLE             IVC RV Basal diam:  5.20 cm     IVC diam: 2.50 cm RV Mid diam:    4.00 cm RV S prime:     11.40 cm/s TAPSE (M-mode): 1.8 cm  LEFT ATRIUM              Index  RIGHT ATRIUM           Index LA diam:        4.20 cm  1.83 cm/m  RA Area:     31.50 cm LA Vol (A2C):   166.0 ml 72.28 ml/m RA Volume:   114.00 ml 49.64 ml/m LA Vol (A4C):   94.7 ml  41.23 ml/m LA Biplane Vol: 134.0 ml 58.34 ml/m  AORTIC VALVE LVOT Vmax:   102.00 cm/s LVOT Vmean:  63.200 cm/s LVOT VTI:    0.145 m   AORTA Ao Root diam: 3.20 cm Ao Asc diam:  3.40 cm  MITRAL VALVE MV Area (PHT): 5.66 cm              SHUNTS MV PHT:        38.86 msec            Systemic VTI:  0.14 m MV Decel Time: 134 msec              Systemic Diam: 2.00 cm MV E velocity: 102.00 cm/s 103 cm/s MV A velocity: 65.50 cm/s  70.3 cm/s MV E/A ratio:  1.56        1.5      Laboratory Data:  High Sensitivity Troponin:   Recent Labs  Lab 11/01/18 0149 11/01/18 0301  TROPONINIHS 85* 81*     Chemistry Recent Labs  Lab 11/02/18 0506 11/03/18 0259 11/04/18 0401  NA 141 143 143  K 4.0 3.2* 3.8  CL 100 96* 93*  CO2 30 39* 38*  GLUCOSE 124* 154* 109*  BUN 26* 20 17  CREATININE 1.26* 1.31* 1.10*  CALCIUM 8.9 8.5* 9.2  GFRNONAA 51* 48* 60*  GFRAA 59* 56* >60  ANIONGAP Recent Labs  Lab 10/31/18 1947  PROT 6.9  ALBUMIN  3.3*  AST 13*  ALT 23  ALKPHOS 72  BILITOT 0.5   Hematology Recent Labs  Lab 11/02/18 0506 11/03/18 0259 11/04/18 0401  WBC 13.7* 10.7* 10.0  RBC 4.39 4.13 4.45  HGB 12.9 12.0 12.9  HCT 43.3 40.5 43.3  MCV 98.6 98.1 97.3  MCH 29.4 29.1 29.0  MCHC 29.8* 29.6* 29.8*  RDW 15.8* 15.7* 15.5  PLT 424* 383 374   BNP Recent Labs  Lab 10/31/18 1947  BNP 931.6*    DDimer No results for input(s): DDIMER in the last 168 hours.   Radiology/Studies:  Dg Chest 2 View  Result Date: 10/31/2018 CLINICAL DATA:  Progressive shortness of breath over several days. Asthma. EXAM: CHEST - 2 VIEW COMPARISON:  None. FINDINGS: Severe cardiomegaly is demonstrated, as well as pulmonary vascular congestion. Ill-defined area of airspace opacity is seen in the right upper lobe, suspicious for pneumonia. Tiny right pleural effusion is also seen. Several old bilateral rib fracture deformities are noted. IMPRESSION: Right upper lobe airspace opacity, suspicious for pneumonia. Recommend chest radiographic follow-up to confirm resolution. Tiny right pleural effusion. Severe cardiomegaly with pulmonary vascular congestion. Electronically Signed   By: Danae Orleans M.D.   On: 10/31/2018 18:50   Dg Chest Port 1 View  Result Date: 11/02/2018 CLINICAL DATA:  Shortness of breath. EXAM: PORTABLE CHEST 1 VIEW COMPARISON:  Chest x-ray dated 10/31/2018. FINDINGS: Improved aeration within the RIGHT upper lobe suggesting a resolving pneumonia or resolving asymmetric pulmonary edema. Lungs otherwise clear, although the LEFT lower lung is obscured by the overlying heart shadow. Stable marked cardiomegaly. No acute appearing osseous abnormality. IMPRESSION: 1. Improved aeration within the RIGHT upper lobe  suggesting resolving pneumonia or resolving edema. 2. Stable marked cardiomegaly. No evidence of associated CHF at this time. Electronically Signed   By: Bary Richard M.D.   On: 11/02/2018 13:27    Assessment and Plan:   1.  Acute systolic and diastolic HF improved now with neg 10,201 now on lasix 40 mg IV daily, check TSH  Discussed low salt diet and wt loss.  She lost 20 lbs before move, she had considered bariatric surgery but she would prefer not to have.  2. Cardiomyopathy EF 25-30% may need cardiac CTA vs cath to eval for CAD with diabetes.  Could eval RHC as well.  Dr. Flora Lipps to see. No  angina, Cr slightly elevated once stable after IV diuretics would benefit from ARB to entresto   Add spironolactone once improved.  BB would be beneficial but with asthma unsure if we could add.  Once acute episode improved would add.  Check lipids. 3. HTN poorly controlled on hydralazine may benefit from changing to biDil for both HTN and cardiomyopathy.   4. DM-2 on no meds, had run out per IM Hgb A1c 8.5  5. Asthma treated and possible PNA this admit.  rec'd ABX now stopped.  Per IM    For questions or updates, please contact [facility] Please consult [email or web address] for contact info under     Signed, Nada Boozer, NP  11/04/2018 3:58 PM

## 2018-11-04 NOTE — Progress Notes (Signed)
NAME:  Julie Benjamin, MRN:  [id number], DOB:  [birth year], LOS: 3 ADMISSION DATE:  10/31/2018, CONSULTATION DATE:  11/02/18 REFERRING MD:  Dairl Ponder, CHIEF COMPLAINT:  Confusion   Brief History   48 year old woman with hx of metabolic syndrome, asthma presenting with multifactorial respiratory failure.  History of present illness   48 year old woman with history of metabolic syndrome, asthma presented 10/31 for increasing SOB found to have RUL infiltrate.  Admitted for tx of PNA and fluid overload.  Today was more SOB and confused so pulmonology was consulted.  Patient able to state name but that's about it.  History is therefore per chart review.  Past Medical History   Past Medical History:  Diagnosis Date  . Asthma   . Diabetes mellitus without complication (HCC)   . Hypertension   . Respiratory failure with hypoxia (HCC) 11/01/2018   Significant Hospital Events   10/31 admitted  Consults:  PCCM  Procedures:  NA  Significant Diagnostic Tests:  ABG 11/02/18 - 7.239/95/98  Micro Data:  SARS2 neg  Antimicrobials:  Ceftiaxone/azithromycin >>  Interim history/subjective:  Reports feeling better.  Objective   Blood pressure (!) 158/108, pulse (!) 110, temperature 98.5 F (36.9 C), temperature source Oral, resp. rate 20, height 5\' 3"  (1.6 m), weight 130.7 kg, SpO2 96 %.    FiO2 (%):  [30 %] 30 %   Intake/Output Summary (Last 24 hours) at 11/04/2018 1110 Last data filed at 11/04/2018 0100 Gross per 24 hour  Intake 14.63 ml  Output 4350 ml  Net -4335.37 ml   Filed Weights   11/02/18 1119 11/03/18 0500 11/04/18 0749  Weight: 136 kg 134.3 kg 130.7 kg   Physical Exam: General: Morbid obese female no acute distress not currently on oxygen HEENT: No JVD no lymphadenopathy appreciated Neuro: Grossly intact CV: s1s2 regular rate and rhythm PULM: Good air movement GI: soft, bsx4 active obese Extremities: warm/dry, 1+ edema  Skin: no rashes or lesions   Resolved  Hospital Problem list   NA  Assessment & Plan:   #Acute hypercarbic respiratory failure in setting of OHS and possible fluid overload #Underlying asthma no obvious bronchospasm #Pct neg, doubt infectious component BiPAP nightly and when sleeping Continue Lasix Heart healthy diet No sedating medications Continue pulmonary toilet and nebs We will need outpatient follow-up for obesity hypoventilation syndrome Consider transferring to [facility] service Note she is a -4 L over 24 hours  #Hypertension Currently on hydralazine   #Hypokalemia Recent Labs  Lab 11/02/18 0506 11/03/18 0259 11/04/18 0401  K 4.0 3.2* 3.8    Monitor replete  Best practice:  Diet: Heart healthy diet Pain/Anxiety/Delirium protocol (if indicated): NA VAP protocol (if indicated): NA DVT prophylaxis: lovenox GI prophylaxis: pepcid Glucose control: SSI Mobility: BR Code Status: Full Family Communication: November 04, 2018 patient updated at bedside Disposition: Currently in stepdown unit.  Awake alert not on oxygen O2 sats are 94% on room air  Labs   CBC: Recent Labs  Lab 10/31/18 1947 11/02/18 0506 11/03/18 0259 11/04/18 0401  WBC 10.7* 13.7* 10.7* 10.0  NEUTROABS 9.0*  --   --   --   HGB 12.5 12.9 12.0 12.9  HCT 41.6 43.3 40.5 43.3  MCV 97.7 98.6 98.1 97.3  PLT 366 424* 383 374    Basic Metabolic Panel: Recent Labs  Lab 10/31/18 1947 11/01/18 0301 11/02/18 0506 11/03/18 0259 11/04/18 0401  NA 142  --  141 143 143  K 3.4*  --  4.0 3.2*  3.8  CL 101  --  100 96* 93*  CO2 31  --  30 39* 38*  GLUCOSE 120*  --  124* 154* 109*  BUN 21*  --  26* 20 17  CREATININE 1.45*  --  1.26* 1.31* 1.10*  CALCIUM 8.9  --  8.9 8.5* 9.2  MG  --  1.8 2.0  --   --    GFR: Estimated Creatinine Clearance: 83.5 mL/min (A) (by C-G formula based on SCr of 1.1 mg/dL (H)). Recent Labs  Lab 10/31/18 1947 11/01/18 0301 11/02/18 0506 11/03/18 0259 11/04/18 0401  PROCALCITON  --  <0.10 <0.10  --   --    WBC 10.7*  --  13.7* 10.7* 10.0    Liver Function Tests: Recent Labs  Lab 10/31/18 1947  AST 13*  ALT 23  ALKPHOS 72  BILITOT 0.5  PROT 6.9  ALBUMIN 3.3*   No results for input(s): LIPASE, AMYLASE in the last 168 hours. No results for input(s): AMMONIA in the last 168 hours.  ABG    Component Value Date/Time   PHART 7.239 (L) 11/02/2018 1130   PCO2ART 95.0 (HH) 11/02/2018 1130   PO2ART 98.5 11/02/2018 1130   HCO3 39.2 (H) 11/02/2018 1130   O2SAT 96.5 11/02/2018 1130     Coagulation Profile: No results for input(s): INR, PROTIME in the last 168 hours.  Cardiac Enzymes: No results for input(s): CKTOTAL, CKMB, CKMBINDEX, TROPONINI in the last 168 hours.  HbA1C: Hgb A1c MFr Bld  Date/Time Value Ref Range Status  11/01/2018 03:01 AM 8.5 (H) 4.8 - 5.6 % Final    Comment:    (NOTE) Pre diabetes:          5.7%-6.4% Diabetes:              >6.4% Glycemic control for   <7.0% adults with diabetes     CBG: Recent Labs  Lab 11/03/18 0856 11/03/18 1113 11/03/18 1512 11/03/18 2151 11/04/18 0700  GLUCAP 211* 136* 94 163* 113*    Critical care time: 0 minutes    Richardson Landry Itali Mckendry ACNP Maryanna Shape PCCM Pager [phone] till 1 pm If no answer page [phone] 11/04/2018, 11:10 AM

## 2018-11-04 NOTE — TOC Progression Note (Addendum)
Transition of Care [facility]) - Progression Note    Patient Details  Name: [name] MRN: [id number] Date of Birth: [birth year]  [facility]) CM/SW Contact  Zenon Mayo, RN Phone Number: 11/04/2018, 4:42 PM  Clinical Narrative:    MD informed NCM patient will need a bipap, will look into doing a bipap thru LOG since patient does not have any insurance.  Will discuss with Zack Ast DIr of TOC tomorrow.    11/5- NCM sent information out for LOG for bipap, patient will need [facility] for CHF disease management , [facility] is doing charity this week, made referral.  Cassie will let NCM know if patient qualifies for charity.  The bipap will be thru [facility], respiratory will come to work with patient tomorrow prior to dc home.   Expected Discharge Plan: Home/Self Care Barriers to Discharge: Continued Medical Work up  Expected Discharge Plan and Services Expected Discharge Plan: Home/Self Care                                               Social Determinants of Health (SDOH) Interventions    Readmission Risk Interventions No flowsheet data found.

## 2018-11-04 NOTE — Progress Notes (Signed)
[facility] Progress Note Patient Name: Julie Benjamin DOB: 16-Mar-1970 MRN: [id number]   Date of Service  11/04/2018  HPI/Events of Note  Request to transfer patient to progressive care to make a bed for a "code cool" patient.   eICU Interventions  Will transfer patient to progressive care bed.      Intervention Category Major Interventions: Other:  [name] 11/04/2018, 6:00 AM

## 2018-11-05 ENCOUNTER — Encounter (HOSPITAL_COMMUNITY)
Admission: EM | Disposition: A | Discharge status: Home Health | Payer: Self-pay | Source: Home / Self Care | Attending: Pulmonary Disease

## 2018-11-05 ENCOUNTER — Inpatient Hospital Stay (HOSPITAL_COMMUNITY): Payer: Self-pay

## 2018-11-05 DIAGNOSIS — I428 Other cardiomyopathies: Secondary | ICD-10-CM

## 2018-11-05 HISTORY — PX: RIGHT/LEFT HEART CATH AND CORONARY ANGIOGRAPHY: CATH118266

## 2018-11-05 LAB — CBC WITH DIFFERENTIAL/PLATELET
Abs Immature Granulocytes: 0.05 10*3/uL (ref 0.00–0.07)
Basophils Absolute: 0.1 10*3/uL (ref 0.0–0.1)
Basophils Relative: 1 %
Eosinophils Absolute: 0.1 10*3/uL (ref 0.0–0.5)
Eosinophils Relative: 1 %
HCT: 41.9 % (ref 36.0–46.0)
Hemoglobin: 12.9 g/dL (ref 12.0–15.0)
Immature Granulocytes: 1 %
Lymphocytes Relative: 13 %
Lymphs Abs: 1.4 10*3/uL (ref 0.7–4.0)
MCH: 29.1 pg (ref 26.0–34.0)
MCHC: 30.8 g/dL (ref 30.0–36.0)
MCV: 94.6 fL (ref 80.0–100.0)
Monocytes Absolute: 0.7 10*3/uL (ref 0.1–1.0)
Monocytes Relative: 7 %
Neutro Abs: 8 10*3/uL — ABNORMAL HIGH (ref 1.7–7.7)
Neutrophils Relative %: 77 %
Platelets: 381 10*3/uL (ref 150–400)
RBC: 4.43 MIL/uL (ref 3.87–5.11)
RDW: 15.2 % (ref 11.5–15.5)
WBC: 10.3 10*3/uL (ref 4.0–10.5)
nRBC: 0 % (ref 0.0–0.2)

## 2018-11-05 LAB — POCT I-STAT 7, (LYTES, BLD GAS, ICA,H+H)
Acid-Base Excess: 7 mmol/L — ABNORMAL HIGH (ref 0.0–2.0)
Bicarbonate: 33.2 mmol/L — ABNORMAL HIGH (ref 20.0–28.0)
Calcium, Ion: 1.09 mmol/L — ABNORMAL LOW (ref 1.15–1.40)
HCT: 39 % (ref 36.0–46.0)
Hemoglobin: 13.3 g/dL (ref 12.0–15.0)
O2 Saturation: 94 %
Potassium: 3.8 mmol/L (ref 3.5–5.1)
Sodium: 143 mmol/L (ref 135–145)
TCO2: 35 mmol/L — ABNORMAL HIGH (ref 22–32)
pCO2 arterial: 52.9 mmHg — ABNORMAL HIGH (ref 32.0–48.0)
pH, Arterial: 7.405 (ref 7.350–7.450)
pO2, Arterial: 73 mmHg — ABNORMAL LOW (ref 83.0–108.0)

## 2018-11-05 LAB — BASIC METABOLIC PANEL
Anion gap: 11 (ref 5–15)
BUN: 21 mg/dL — ABNORMAL HIGH (ref 6–20)
CO2: 36 mmol/L — ABNORMAL HIGH (ref 22–32)
Calcium: 9.1 mg/dL (ref 8.9–10.3)
Chloride: 91 mmol/L — ABNORMAL LOW (ref 98–111)
Creatinine, Ser: 1.29 mg/dL — ABNORMAL HIGH (ref 0.44–1.00)
GFR calc Af Amer: 57 mL/min — ABNORMAL LOW (ref >60)
GFR calc non Af Amer: 49 mL/min — ABNORMAL LOW (ref >60)
Glucose, Bld: 120 mg/dL — ABNORMAL HIGH (ref 70–99)
Potassium: 3.3 mmol/L — ABNORMAL LOW (ref 3.5–5.1)
Sodium: 138 mmol/L (ref 135–145)

## 2018-11-05 LAB — POCT ACTIVATED CLOTTING TIME: Activated Clotting Time: 180 seconds

## 2018-11-05 LAB — TSH: TSH: 0.723 u[IU]/mL (ref 0.350–4.500)

## 2018-11-05 LAB — GLUCOSE, CAPILLARY
Glucose-Capillary: 138 mg/dL — ABNORMAL HIGH (ref 70–99)
Glucose-Capillary: 122 mg/dL — ABNORMAL HIGH (ref 70–99)
Glucose-Capillary: 134 mg/dL — ABNORMAL HIGH (ref 70–99)
Glucose-Capillary: 136 mg/dL — ABNORMAL HIGH (ref 70–99)
Glucose-Capillary: 246 mg/dL — ABNORMAL HIGH (ref 70–99)

## 2018-11-05 LAB — PHOSPHORUS: Phosphorus: 2.6 mg/dL (ref 2.5–4.6)

## 2018-11-05 LAB — LIPID PANEL
Cholesterol: 132 mg/dL (ref 0–200)
HDL: 43 mg/dL (ref >40)
LDL Cholesterol: 70 mg/dL (ref 0–99)
Total CHOL/HDL Ratio: 3.1 RATIO
Triglycerides: 97 mg/dL (ref <150)
VLDL: 19 mg/dL (ref 0–40)

## 2018-11-05 LAB — PROTIME-INR
INR: 1 (ref 0.8–1.2)
Prothrombin Time: 13.2 seconds (ref 11.4–15.2)

## 2018-11-05 LAB — MAGNESIUM: Magnesium: 1.8 mg/dL (ref 1.7–2.4)

## 2018-11-05 SURGERY — RIGHT/LEFT HEART CATH AND CORONARY ANGIOGRAPHY
Anesthesia: LOCAL

## 2018-11-05 MED ORDER — HEPARIN (PORCINE) IN NACL 1000-0.9 UT/500ML-% IV SOLN
INTRAVENOUS | Status: DC | PRN
  Administered 2018-11-05 (×2): 500 mL

## 2018-11-05 MED ORDER — ACETAMINOPHEN 325 MG PO TABS: 650.0000 mg | ORAL_TABLET | ORAL | Status: DC | PRN

## 2018-11-05 MED ORDER — SODIUM CHLORIDE 0.9 % IV SOLN
INTRAVENOUS | Status: AC
  Administered 2018-11-05: 18:00:00 via INTRAVENOUS

## 2018-11-05 MED ORDER — VERAPAMIL HCL 2.5 MG/ML IV SOLN
INTRAVENOUS | Status: DC | PRN
  Administered 2018-11-05: 10 mL via INTRA_ARTERIAL

## 2018-11-05 MED ORDER — HEPARIN SODIUM (PORCINE) 1000 UNIT/ML IJ SOLN
INTRAMUSCULAR | Status: AC
  Filled 2018-11-05: qty 1

## 2018-11-05 MED ORDER — HEPARIN (PORCINE) IN NACL 1000-0.9 UT/500ML-% IV SOLN
INTRAVENOUS | Status: AC
  Filled 2018-11-05: qty 1000

## 2018-11-05 MED ORDER — SODIUM CHLORIDE 0.9% FLUSH
3.0000 mL | Freq: Two times a day (BID) | INTRAVENOUS | Status: DC
  Administered 2018-11-05 – 2018-11-06 (×2): 3 mL via INTRAVENOUS

## 2018-11-05 MED ORDER — CARVEDILOL 25 MG PO TABS
25.0000 mg | ORAL_TABLET | Freq: Two times a day (BID) | ORAL | Status: DC
  Administered 2018-11-05 – 2018-11-07 (×4): 25 mg via ORAL
  Filled 2018-11-05 (×5): qty 1

## 2018-11-05 MED ORDER — VERAPAMIL HCL 2.5 MG/ML IV SOLN
INTRAVENOUS | Status: AC
  Filled 2018-11-05: qty 2

## 2018-11-05 MED ORDER — MIDAZOLAM HCL 2 MG/2ML IJ SOLN
INTRAMUSCULAR | Status: DC | PRN
  Administered 2018-11-05: 1 mg via INTRAVENOUS

## 2018-11-05 MED ORDER — LIDOCAINE HCL (PF) 1 % IJ SOLN
INTRAMUSCULAR | Status: AC
  Filled 2018-11-05: qty 30

## 2018-11-05 MED ORDER — FUROSEMIDE 10 MG/ML IJ SOLN
40.0000 mg | Freq: Once | INTRAMUSCULAR | Status: AC
  Administered 2018-11-05: 40 mg via INTRAVENOUS
  Filled 2018-11-05: qty 4

## 2018-11-05 MED ORDER — HEPARIN SODIUM (PORCINE) 1000 UNIT/ML IJ SOLN
INTRAMUSCULAR | Status: DC | PRN
  Administered 2018-11-05: 5000 [IU] via INTRAVENOUS

## 2018-11-05 MED ORDER — MIDAZOLAM HCL 2 MG/2ML IJ SOLN
INTRAMUSCULAR | Status: AC
  Filled 2018-11-05: qty 2

## 2018-11-05 MED ORDER — LOSARTAN POTASSIUM 50 MG PO TABS
100.0000 mg | ORAL_TABLET | Freq: Every day | ORAL | Status: DC
  Administered 2018-11-06 – 2018-11-07 (×2): 100 mg via ORAL
  Filled 2018-11-05 (×2): qty 2

## 2018-11-05 MED ORDER — POTASSIUM CHLORIDE CRYS ER 20 MEQ PO TBCR
40.0000 meq | EXTENDED_RELEASE_TABLET | ORAL | Status: AC
  Administered 2018-11-05 (×2): 40 meq via ORAL
  Filled 2018-11-05 (×2): qty 2

## 2018-11-05 MED ORDER — MAGNESIUM SULFATE IN D5W 1-5 GM/100ML-% IV SOLN
1.0000 g | Freq: Once | INTRAVENOUS | Status: AC
  Administered 2018-11-05: 09:00:00 1 g via INTRAVENOUS
  Filled 2018-11-05: qty 100

## 2018-11-05 MED ORDER — SODIUM CHLORIDE 0.9 % IV SOLN: 250.0000 mL | INTRAVENOUS | Status: DC | PRN

## 2018-11-05 MED ORDER — SODIUM CHLORIDE 0.9% FLUSH: 3.0000 mL | INTRAVENOUS | Status: DC | PRN

## 2018-11-05 MED ORDER — ENOXAPARIN SODIUM 40 MG/0.4ML ~~LOC~~ SOLN
40.0000 mg | SUBCUTANEOUS | Status: DC
  Administered 2018-11-06 – 2018-11-07 (×2): 40 mg via SUBCUTANEOUS
  Filled 2018-11-05 (×2): qty 0.4

## 2018-11-05 MED ORDER — SPIRONOLACTONE 25 MG PO TABS
25.0000 mg | ORAL_TABLET | Freq: Every day | ORAL | Status: DC
  Administered 2018-11-05 – 2018-11-07 (×3): 25 mg via ORAL
  Filled 2018-11-05 (×2): qty 1

## 2018-11-05 MED ORDER — LABETALOL HCL 5 MG/ML IV SOLN: 10.0000 mg | INTRAVENOUS | Status: AC | PRN

## 2018-11-05 MED ORDER — HYDRALAZINE HCL 20 MG/ML IJ SOLN: 10.0000 mg | INTRAMUSCULAR | Status: AC | PRN

## 2018-11-05 MED ORDER — IOHEXOL 350 MG/ML SOLN
INTRAVENOUS | Status: DC | PRN
  Administered 2018-11-05: 40 mL

## 2018-11-05 MED ORDER — IPRATROPIUM-ALBUTEROL 0.5-2.5 (3) MG/3ML IN SOLN
3.0000 mL | Freq: Two times a day (BID) | RESPIRATORY_TRACT | Status: DC
  Administered 2018-11-05 – 2018-11-07 (×4): 3 mL via RESPIRATORY_TRACT
  Filled 2018-11-05 (×5): qty 3

## 2018-11-05 MED ORDER — ISOSORB DINITRATE-HYDRALAZINE 20-37.5 MG PO TABS
2.0000 | ORAL_TABLET | Freq: Three times a day (TID) | ORAL | Status: DC
  Administered 2018-11-05: 09:00:00 2 via ORAL
  Filled 2018-11-05: qty 2

## 2018-11-05 MED ORDER — LIDOCAINE HCL (PF) 1 % IJ SOLN
INTRAMUSCULAR | Status: DC | PRN
  Administered 2018-11-05 (×2): 2 mL via INTRADERMAL
  Administered 2018-11-05: 15 mL via INTRADERMAL

## 2018-11-05 MED ORDER — ONDANSETRON HCL 4 MG/2ML IJ SOLN: 4.0000 mg | Freq: Four times a day (QID) | INTRAMUSCULAR | Status: DC | PRN

## 2018-11-05 SURGICAL SUPPLY — 14 items
CATH 5FR JL3.5 JR4 ANG PIG MP (CATHETERS) ×1 IMPLANT
CATH SWAN GANZ 7F STRAIGHT (CATHETERS) ×1 IMPLANT
DEVICE RAD COMP TR BAND LRG (VASCULAR PRODUCTS) ×1 IMPLANT
GLIDESHEATH SLEND SS 6F .021 (SHEATH) ×1 IMPLANT
GUIDEWIRE INQWIRE 1.5J.035X260 (WIRE) IMPLANT
HOVERMATT SINGLE USE (MISCELLANEOUS) ×1 IMPLANT
INQWIRE 1.5J .035X260CM (WIRE) ×2
KIT HEART LEFT (KITS) ×2 IMPLANT
PACK CARDIAC CATHETERIZATION (CUSTOM PROCEDURE TRAY) ×2 IMPLANT
SHEATH GLIDE SLENDER 4/5FR (SHEATH) ×1 IMPLANT
SHEATH PINNACLE 7F 10CM (SHEATH) ×1 IMPLANT
SHEATH PROBE COVER 6X72 (BAG) ×1 IMPLANT
TRANSDUCER W/STOPCOCK (MISCELLANEOUS) ×2 IMPLANT
TUBING CIL FLEX 10 FLL-RA (TUBING) ×2 IMPLANT

## 2018-11-05 NOTE — Progress Notes (Signed)
Site area- right groin a 7 french venous sheath was removed  Site Prior to Removal- 0   Pressure Applied For-  20 MInutes   Bedrest Beginning at - 1645   Manual- Yes   Patient Status During Pull- Stable    Post Pull Groin Site- 0   Post Pull Instructions Given- Yes   Post Pull Pulses Present- Yes    Dressing Applied- Tegaderm and Gauze Dressing    Comments:  tol well.  Pull by laura murphy, rn

## 2018-11-05 NOTE — Progress Notes (Signed)
Patient taken down for heart cath report given to cath lab nurse.

## 2018-11-05 NOTE — Progress Notes (Signed)
RT offered pt BIPAP for the night and pt declined stating she does not need it. RT asked pt to call if she decides she would like to wear it tonight. RT will continue to monitor.

## 2018-11-05 NOTE — H&P (View-Only) (Signed)
Cardiology Progress Note  Patient ID: Julie Benjamin MRN: [id number] DOB: Dec 07, 1970 Date of Encounter: 11/05/2018  Primary Cardiologist: Reatha Harps, MD  Subjective  Good urine output overnight.  Euvolemic on exam.  She reports her breathing is much better.  Remains tachycardic and blood pressure a bit elevated.  Increase medications this morning.  ROS:  All other ROS reviewed and negative. Pertinent positives noted in the HPI.     Inpatient Medications  Scheduled Meds: . budesonide (PULMICORT) nebulizer solution  0.5 mg Nebulization BID  . carvedilol  25 mg Oral BID WC  . Chlorhexidine Gluconate Cloth  6 each Topical Daily  . enoxaparin (LOVENOX) injection  40 mg Subcutaneous Q24H  . famotidine  20 mg Oral Daily  . influenza vac split quadrivalent PF  0.5 mL Intramuscular Tomorrow-1000  . insulin aspart  0-9 Units Subcutaneous TID WC  . ipratropium-albuterol  3 mL Nebulization TID  . isosorbide-hydrALAZINE  2 tablet Oral TID  . mouth rinse  15 mL Mouth Rinse BID  . montelukast  10 mg Oral QHS  . pneumococcal 23 valent vaccine  0.5 mL Intramuscular Tomorrow-1000  . potassium chloride  40 mEq Oral Q2H  . sodium chloride flush  3 mL Intravenous Q12H  . sodium chloride flush  3 mL Intravenous Q12H  . spironolactone  25 mg Oral Daily   Continuous Infusions: . sodium chloride Stopped (11/03/18 1301)  . sodium chloride    . sodium chloride 10 mL/hr at 11/05/18 2297  . magnesium sulfate bolus IVPB     PRN Meds: sodium chloride, sodium chloride, acetaminophen **OR** acetaminophen, albuterol, hydrALAZINE, ondansetron **OR** ondansetron (ZOFRAN) IV, polyethylene glycol, sodium chloride flush, sodium chloride flush   Vital Signs   Vitals:   11/05/18 0457 11/05/18 0713 11/05/18 0714 11/05/18 0743  BP: (!) 152/102   (!) 158/91  Pulse: 93   100  Resp: 20   (!) 21  Temp: 98.3 F (36.8 C)   98 F (36.7 C)  TempSrc: Oral     SpO2: 95% 98% 98% 94%  Weight:      Height:        Intake/Output Summary (Last 24 hours) at 11/05/2018 0800 Last data filed at 11/05/2018 9892 Gross per 24 hour  Intake 1441.96 ml  Output 2900 ml  Net -1458.04 ml   Last 3 Weights 11/05/2018 11/04/2018 11/04/2018  Weight (lbs) 285 lb 288 lb 3.2 oz 289 lb 14.5 oz  Weight (kg) 129.275 kg 130.727 kg 131.5 kg      Telemetry  Overnight telemetry shows sinus tachycardia with heart rate in the low 100s, which I personally reviewed.   ECG  The most recent ECG shows sinus tachycardia with LVH, which I personally reviewed.   Physical Exam   Vitals:   11/05/18 0457 11/05/18 0713 11/05/18 0714 11/05/18 0743  BP: (!) 152/102   (!) 158/91  Pulse: 93   100  Resp: 20   (!) 21  Temp: 98.3 F (36.8 C)   98 F (36.7 C)  TempSrc: Oral     SpO2: 95% 98% 98% 94%  Weight:      Height:         Intake/Output Summary (Last 24 hours) at 11/05/2018 0800 Last data filed at 11/05/2018 1194 Gross per 24 hour  Intake 1441.96 ml  Output 2900 ml  Net -1458.04 ml    Last 3 Weights 11/05/2018 11/04/2018 11/04/2018  Weight (lbs) 285 lb 288 lb 3.2 oz 289 lb 14.5 oz  Weight (kg)  129.275 kg 130.727 kg 131.5 kg    Body mass index is 50.49 kg/m.  General: Obese female no acute distress Head: Atraumatic, normal size  Eyes: PEERLA, EOMI  Neck: Supple, JVD difficult to assess due to neck adiposity Endocrine: No thryomegaly Cardiac: Tachycardia noted, no murmurs rubs or gallops Lungs: Lungs appear to be clear Abd: Soft, nontender, no hepatomegaly  Ext: No edema Musculoskeletal: No deformities, BUE and BLE strength normal and equal Skin: Warm and dry, no rashes   Neuro: Alert and oriented to person, place, time, and situation, CNII-XII grossly intact, no focal deficits  Psych: Normal mood and affect   Labs  High Sensitivity Troponin:   Recent Labs  Lab 11/01/18 0149 11/01/18 0301  TROPONINIHS 85* 81*     Cardiac EnzymesNo results for input(s): TROPONINI in the last 168 hours. No results for input(s):  TROPIPOC in the last 168 hours.  Chemistry Recent Labs  Lab 10/31/18 1947  11/03/18 0259 11/04/18 0401 11/05/18 0312  NA 142   < > 143 143 138  K 3.4*   < > 3.2* 3.8 3.3*  CL 101   < > 96* 93* 91*  CO2 31   < > 39* 38* 36*  GLUCOSE 120*   < > 154* 109* 120*  BUN 21*   < > 20 17 21*  CREATININE 1.45*   < > 1.31* 1.10* 1.29*  CALCIUM 8.9   < > 8.5* 9.2 9.1  PROT 6.9  --   --   --   --   ALBUMIN 3.3*  --   --   --   --   AST 13*  --   --   --   --   ALT 23  --   --   --   --   ALKPHOS 72  --   --   --   --   BILITOT 0.5  --   --   --   --   GFRNONAA 43*   < > 48* 60* 49*  GFRAA 50*   < > 56* >60 57*  ANIONGAP 10   < > 8 12 11    < > = values in this interval not displayed.    Hematology Recent Labs  Lab 11/03/18 0259 11/04/18 0401 11/05/18 0312  WBC 10.7* 10.0 10.3  RBC 4.13 4.45 4.43  HGB 12.0 12.9 12.9  HCT 40.5 43.3 41.9  MCV 98.1 97.3 94.6  MCH 29.1 29.0 29.1  MCHC 29.6* 29.8* 30.8  RDW 15.7* 15.5 15.2  PLT 383 374 381   BNP Recent Labs  Lab 10/31/18 1947  BNP 931.6*    DDimer No results for input(s): DDIMER in the last 168 hours.   Radiology  Dg Chest Port 1 View  Result Date: 11/05/2018 CLINICAL DATA:  Shortness of breath, respiratory failure. EXAM: PORTABLE CHEST 1 VIEW COMPARISON:  11/02/2018 and 10/31/2018 FINDINGS: Lungs are adequately inflated with resolution of the previously seen airspace process over the right upper lobe. Mild vertical linear density left retrocardiac region unchanged likely atelectasis. Moderate stable cardiomegaly. Remainder the exam is unchanged. IMPRESSION: Resolution of previously seen right upper lobe airspace process. Linear atelectasis left retrocardiac region. Moderate stable cardiomegaly. Electronically Signed   By: [name] M.D.   On: 11/05/2018 07:42    Cardiac Studies  TTE 11/02/2018  1. Left ventricular ejection fraction, by visual estimation, is 25 to 30%. The left ventricle has severely decreased function. There  is mildly increased left ventricular hypertrophy.  2. Definity contrast agent was given IV to delineate the left ventricular endocardial borders.  3. Elevated left atrial pressure.  4. Left ventricular diastolic parameters are consistent with Grade II diastolic dysfunction (pseudonormalization).  5. Severely dilated left ventricular internal cavity size.  6. Global right ventricle has normal systolic function.The right ventricular size is moderately enlarged. No increase in right ventricular wall thickness.  7. Left atrial size was severely dilated.  8. Right atrial size was moderately dilated.  9. The mitral valve is normal in structure. Trace mitral valve regurgitation. 10. The tricuspid valve is not well visualized. Tricuspid valve regurgitation is not demonstrated. 11. The aortic valve is tricuspid. Aortic valve regurgitation is not visualized.  Patient Profile  Julie Benjamin is a 48 y.o. female with uncontrolled diabetes and hypertension and obesity who was admitted on 10/31 for acute decompensated systolic heart failure.  Assessment & Plan   1.  Acute decompensated systolic heart failure, ejection fraction 25 to 30% -Likely related to uncontrolled diabetes and uncontrolled hypertension as well as morbid obesity. -We will proceed with a left and right heart catheterization today to better assess coronary arteries as well as to get an idea of what her volume status is. -Her creatinine has bumped and I have stopped her diuresis for now.  She does appear euvolemic on exam. -I will increase her carvedilol to 25 mg twice daily -I have increased her BiDil to 2 tablets of 20-37.5 mg 3 times daily.  I anticipate this transition her to an ARB tomorrow after her contrast load from cardiac cath.  I suspect insurance may be a problem for Entresto. -I also increased her Aldactone to 25 mg daily -We will hold diuresis today and likely transition her to p.o. Lasix tomorrow.  She needs extensive  counseling on diet modification. -Pending results of the above cardiac cath, will plan for medical management for 3 to 6 months and then repeat an echocardiogram to determine her candidacy for ICD therapy -She will need help with getting her diabetes medications as an outpatient.    For questions or updates, please contact [facility] Please consult [email or web address] for contact info under   Signed, Gerri Spore T. Flora Lipps, MD [place]  [facility]  11/05/2018 8:00 AM

## 2018-11-05 NOTE — Progress Notes (Signed)
RT offered to place pt on BIPAP for the night and pt declined stating she feels like she does not need it, that she does not sleep anyway. RT stressed to pt to call if she changes her mind. RT will continue to monitor.

## 2018-11-05 NOTE — Plan of Care (Signed)
Nutrition Education Note  RD consulted for nutrition education regarding new onset CHF.  Spoke with pt at bedside, who was pleasant and in good spirits today. She is currently NPO for a cardiac cath today, but reports consuming 100% of meals yesterday. Pt shares she had a good appetite PTA, however, admits to consuming a lot of takeout food and processed foods, as she recently relocated from [place] to [place]. Prior to her move, pt had made a lot of healthy lifestyle changes and lost approximately 20-25# over the past 6 months. Pt shares that she was consuming less fried foods and more baked foods, as well as consuming a lot of salads, homemade smoothies with frozen fruit and juice, and fresh fruits. She also expressed intention to use garlic, parsley, and pepper to season her foods. She has purchased a scale to monitor her weight at home. Expressed intention to mostly prepare meals at home once she has fully transitioned to her new home.   RD provided "Low Sodium Nutrition Therapy" handout from the [employer]. Reviewed patient's dietary recall. Provided examples on ways to decrease sodium intake in diet. Discouraged intake of processed foods and use of salt shaker. Encouraged fresh fruits and vegetables as well as whole grain sources of carbohydrates to maximize fiber intake.   RD discussed why it is important for patient to adhere to diet recommendations, and emphasized the role of fluids, foods to avoid, and importance of weighing self daily. Teach back method used.  Expect fair to good compliance.  Body mass index is 50.49 kg/m. Pt meets criteria for extreme obesity, class III based on current BMI.  Current diet order is NPO, patient is consuming approximately n/a% of meals at this time. Labs and medications reviewed. No further nutrition interventions warranted at this time. RD contact information provided. If additional nutrition issues arise, please re-consult RD.    Julie Benjamin A. Jimmye Norman, RD, LDN, [place] Registered Dietitian II Certified Diabetes Care and Education Specialist Pager: [phone] After hours Pager: [phone]

## 2018-11-05 NOTE — Interval H&P Note (Signed)
History and Physical Interval Note:  11/05/2018 3:19 PM  Fedra Lanter  has presented today for cardiac cath with the diagnosis of cardiomyopathy.  The various methods of treatment have been discussed with the patient and family. After consideration of risks, benefits and other options for treatment, the patient has consented to  Procedure(s): RIGHT/LEFT HEART CATH AND CORONARY ANGIOGRAPHY (N/A) as a surgical intervention.  The patient's history has been reviewed, patient examined, no change in status, stable for surgery.  I have reviewed the patient's chart and labs.  Questions were answered to the patient's satisfaction.    Cath Lab Visit (complete for each Cath Lab visit)  Clinical Evaluation Leading to the Procedure:   ACS: No.  Non-ACS:    Anginal Classification: CCS II  Anti-ischemic medical therapy: Minimal Therapy (1 class of medications)  Non-Invasive Test Results: No non-invasive testing performed  Prior CABG: No previous CABG        Lauree Chandler

## 2018-11-05 NOTE — Progress Notes (Signed)
Hospitalist progress note  If 7PM-7AM,  night-coverage-look on AMION -prefer pages-not epic chat,please  Julie Benjamin  URK:[id number] DOB: 1970-11-03 DOA: 10/31/2018 PCP: Patient, No Pcp Per  Narrative:  48 year old Fem known asthma, DM TY 2, hypertension admitted 10/31with DOE-developed on 11/1 ??? wob, encephalopathic with elevated CO2 found to have right upper lobe infiltrate and pulmonology consulted-placed on BiPAP  cardiology was consulted as she had new onset systolic heart failure EF 25-30% on echo 11/02/2018 She was diuresed by them Assessment & Plan: Acute systolic diastolic heart failure Cardiac cath today-meds per Cardiology.  Wght from 135-->129 kg Continue coreg 25 bid, Bidil 2 tab 3 x d, aldactone 25 qd watch Co2--is becoming slightly alkalotic Loop diuretics held Cardiomyopathy As above Body mass index is 50.49 kg/m.  OSA not on CPAP Patient will need this set up on d/c-CM is aware Mild hypokalemia Given Kdur 40 x 2, check am labs Right upper lobe infiltrate rec'd Abx until 11/1--felt non infectious--no sputum no s/sym per patient HTN poorly controlled  Meds as above Remote RLE DVT  Will need OP follow up and monitoring CKD ii styable at this time-monitor on dirurectics and aldactone  DM ty ii a1c 8.5  cotinue SSI, CBg ranges 120-140  req only 1 U so far  DVT prophylaxis: Lovenox  Code Status:   Full   Family Communication:   None Disposition Plan: Inpatient  Consultants:   Cardiology  Critical care Procedures:   Echo Antimicrobials:   Azithromycin ceftriaxone through 11/1 Subjective:  Awake coherent in nad no distress Feels still a little swollen no cp no fever no cough no diarr  Objective: Vitals:   11/05/18 0713 11/05/18 0714 11/05/18 0743 11/05/18 1136  BP:   (!) 158/91 (!) 107/58  Pulse:   100 85  Resp:   (!) 21 18  Temp:   98 F (36.7 C) 98 F (36.7 C)  TempSrc:      SpO2: 98% 98% 94% 96%  Weight:      Height:         Intake/Output Summary (Last 24 hours) at 11/05/2018 1159 Last data filed at 11/05/2018 0925 Gross per 24 hour  Intake 1201.96 ml  Output 2200 ml  Net -998.04 ml   Filed Weights   11/04/18 0630 11/04/18 0749 11/05/18 0300  Weight: 131.5 kg 130.7 kg 129.3 kg    Examination: Awake obese coherent in nad Mild exopthalmos s1 s 2no m/r/g cta b abd oebse nt nd no rebound Trace le edema  Data Reviewed: I have personally reviewed following labs and imaging studies CBC: Recent Labs  Lab 10/31/18 1947 11/02/18 0506 11/03/18 0259 11/04/18 0401 11/05/18 0312  WBC 10.7* 13.7* 10.7* 10.0 10.3  NEUTROABS 9.0*  --   --   --  8.0*  HGB 12.5 12.9 12.0 12.9 12.9  HCT 41.6 43.3 40.5 43.3 41.9  MCV 97.7 98.6 98.1 97.3 94.6  PLT 366 424* 383 374 831   Basic Metabolic Panel: Recent Labs  Lab 10/31/18 1947 11/01/18 0301 11/02/18 0506 11/03/18 0259 11/04/18 0401 11/05/18 0312  NA 142  --  141 143 143 138  K 3.4*  --  4.0 3.2* 3.8 3.3*  CL 101  --  100 96* 93* 91*  CO2 31  --  30 39* 38* 36*  GLUCOSE 120*  --  124* 154* 109* 120*  BUN 21*  --  26* 20 17 21*  CREATININE 1.45*  --  1.26* 1.31* 1.10* 1.29*  CALCIUM 8.9  --  8.9 8.5* 9.2 9.1  MG  --  1.8 2.0  --   --  1.8  PHOS  --   --   --   --   --  2.6   GFR: Estimated Creatinine Clearance: 70.8 mL/min (A) (by C-G formula based on SCr of 1.29 mg/dL (H)). Liver Function Tests: Recent Labs  Lab 10/31/18 1947  AST 13*  ALT 23  ALKPHOS 72  BILITOT 0.5  PROT 6.9  ALBUMIN 3.3*   No results for input(s): LIPASE, AMYLASE in the last 168 hours. No results for input(s): AMMONIA in the last 168 hours. Coagulation Profile: Recent Labs  Lab 11/05/18 0312  INR 1.0   Cardiac Enzymes: Radiology Studies: Reviewed images personally in health database  Scheduled Meds: . budesonide (PULMICORT) nebulizer solution  0.5 mg Nebulization BID  . carvedilol  25 mg Oral BID WC  . Chlorhexidine Gluconate Cloth  6 each Topical Daily  .  enoxaparin (LOVENOX) injection  40 mg Subcutaneous Q24H  . famotidine  20 mg Oral Daily  . insulin aspart  0-9 Units Subcutaneous TID WC  . ipratropium-albuterol  3 mL Nebulization TID  . isosorbide-hydrALAZINE  2 tablet Oral TID  . mouth rinse  15 mL Mouth Rinse BID  . montelukast  10 mg Oral QHS  . sodium chloride flush  3 mL Intravenous Q12H  . sodium chloride flush  3 mL Intravenous Q12H  . spironolactone  25 mg Oral Daily   Continuous Infusions: . sodium chloride Stopped (11/03/18 1301)  . sodium chloride    . sodium chloride 10 mL/hr at 11/05/18 0621    LOS: 4 days   Time spent: 30 Pleas Koch, MD [facility]  11/05/2018, 11:59 AM

## 2018-11-05 NOTE — Plan of Care (Signed)

## 2018-11-05 NOTE — Progress Notes (Signed)
Cardiology Progress Note  Patient ID: Julie Benjamin MRN: [id number] DOB: Dec 07, 1970 Date of Encounter: 11/05/2018  Primary Cardiologist: Reatha Harps, MD  Subjective  Good urine output overnight.  Euvolemic on exam.  She reports her breathing is much better.  Remains tachycardic and blood pressure a bit elevated.  Increase medications this morning.  ROS:  All other ROS reviewed and negative. Pertinent positives noted in the HPI.     Inpatient Medications  Scheduled Meds: . budesonide (PULMICORT) nebulizer solution  0.5 mg Nebulization BID  . carvedilol  25 mg Oral BID WC  . Chlorhexidine Gluconate Cloth  6 each Topical Daily  . enoxaparin (LOVENOX) injection  40 mg Subcutaneous Q24H  . famotidine  20 mg Oral Daily  . influenza vac split quadrivalent PF  0.5 mL Intramuscular Tomorrow-1000  . insulin aspart  0-9 Units Subcutaneous TID WC  . ipratropium-albuterol  3 mL Nebulization TID  . isosorbide-hydrALAZINE  2 tablet Oral TID  . mouth rinse  15 mL Mouth Rinse BID  . montelukast  10 mg Oral QHS  . pneumococcal 23 valent vaccine  0.5 mL Intramuscular Tomorrow-1000  . potassium chloride  40 mEq Oral Q2H  . sodium chloride flush  3 mL Intravenous Q12H  . sodium chloride flush  3 mL Intravenous Q12H  . spironolactone  25 mg Oral Daily   Continuous Infusions: . sodium chloride Stopped (11/03/18 1301)  . sodium chloride    . sodium chloride 10 mL/hr at 11/05/18 2297  . magnesium sulfate bolus IVPB     PRN Meds: sodium chloride, sodium chloride, acetaminophen **OR** acetaminophen, albuterol, hydrALAZINE, ondansetron **OR** ondansetron (ZOFRAN) IV, polyethylene glycol, sodium chloride flush, sodium chloride flush   Vital Signs   Vitals:   11/05/18 0457 11/05/18 0713 11/05/18 0714 11/05/18 0743  BP: (!) 152/102   (!) 158/91  Pulse: 93   100  Resp: 20   (!) 21  Temp: 98.3 F (36.8 C)   98 F (36.7 C)  TempSrc: Oral     SpO2: 95% 98% 98% 94%  Weight:      Height:        Intake/Output Summary (Last 24 hours) at 11/05/2018 0800 Last data filed at 11/05/2018 9892 Gross per 24 hour  Intake 1441.96 ml  Output 2900 ml  Net -1458.04 ml   Last 3 Weights 11/05/2018 11/04/2018 11/04/2018  Weight (lbs) 285 lb 288 lb 3.2 oz 289 lb 14.5 oz  Weight (kg) 129.275 kg 130.727 kg 131.5 kg      Telemetry  Overnight telemetry shows sinus tachycardia with heart rate in the low 100s, which I personally reviewed.   ECG  The most recent ECG shows sinus tachycardia with LVH, which I personally reviewed.   Physical Exam   Vitals:   11/05/18 0457 11/05/18 0713 11/05/18 0714 11/05/18 0743  BP: (!) 152/102   (!) 158/91  Pulse: 93   100  Resp: 20   (!) 21  Temp: 98.3 F (36.8 C)   98 F (36.7 C)  TempSrc: Oral     SpO2: 95% 98% 98% 94%  Weight:      Height:         Intake/Output Summary (Last 24 hours) at 11/05/2018 0800 Last data filed at 11/05/2018 1194 Gross per 24 hour  Intake 1441.96 ml  Output 2900 ml  Net -1458.04 ml    Last 3 Weights 11/05/2018 11/04/2018 11/04/2018  Weight (lbs) 285 lb 288 lb 3.2 oz 289 lb 14.5 oz  Weight (kg)  129.275 kg 130.727 kg 131.5 kg    Body mass index is 50.49 kg/m.  General: Obese female no acute distress Head: Atraumatic, normal size  Eyes: PEERLA, EOMI  Neck: Supple, JVD difficult to assess due to neck adiposity Endocrine: No thryomegaly Cardiac: Tachycardia noted, no murmurs rubs or gallops Lungs: Lungs appear to be clear Abd: Soft, nontender, no hepatomegaly  Ext: No edema Musculoskeletal: No deformities, BUE and BLE strength normal and equal Skin: Warm and dry, no rashes   Neuro: Alert and oriented to person, place, time, and situation, CNII-XII grossly intact, no focal deficits  Psych: Normal mood and affect   Labs  High Sensitivity Troponin:   Recent Labs  Lab 11/01/18 0149 11/01/18 0301  TROPONINIHS 85* 81*     Cardiac EnzymesNo results for input(s): TROPONINI in the last 168 hours. No results for input(s):  TROPIPOC in the last 168 hours.  Chemistry Recent Labs  Lab 10/31/18 1947  11/03/18 0259 11/04/18 0401 11/05/18 0312  NA 142   < > 143 143 138  K 3.4*   < > 3.2* 3.8 3.3*  CL 101   < > 96* 93* 91*  CO2 31   < > 39* 38* 36*  GLUCOSE 120*   < > 154* 109* 120*  BUN 21*   < > 20 17 21*  CREATININE 1.45*   < > 1.31* 1.10* 1.29*  CALCIUM 8.9   < > 8.5* 9.2 9.1  PROT 6.9  --   --   --   --   ALBUMIN 3.3*  --   --   --   --   AST 13*  --   --   --   --   ALT 23  --   --   --   --   ALKPHOS 72  --   --   --   --   BILITOT 0.5  --   --   --   --   GFRNONAA 43*   < > 48* 60* 49*  GFRAA 50*   < > 56* >60 57*  ANIONGAP 10   < > 8 12 11    < > = values in this interval not displayed.    Hematology Recent Labs  Lab 11/03/18 0259 11/04/18 0401 11/05/18 0312  WBC 10.7* 10.0 10.3  RBC 4.13 4.45 4.43  HGB 12.0 12.9 12.9  HCT 40.5 43.3 41.9  MCV 98.1 97.3 94.6  MCH 29.1 29.0 29.1  MCHC 29.6* 29.8* 30.8  RDW 15.7* 15.5 15.2  PLT 383 374 381   BNP Recent Labs  Lab 10/31/18 1947  BNP 931.6*    DDimer No results for input(s): DDIMER in the last 168 hours.   Radiology  Dg Chest Port 1 View  Result Date: 11/05/2018 CLINICAL DATA:  Shortness of breath, respiratory failure. EXAM: PORTABLE CHEST 1 VIEW COMPARISON:  11/02/2018 and 10/31/2018 FINDINGS: Lungs are adequately inflated with resolution of the previously seen airspace process over the right upper lobe. Mild vertical linear density left retrocardiac region unchanged likely atelectasis. Moderate stable cardiomegaly. Remainder the exam is unchanged. IMPRESSION: Resolution of previously seen right upper lobe airspace process. Linear atelectasis left retrocardiac region. Moderate stable cardiomegaly. Electronically Signed   By: Marin Olp M.D.   On: 11/05/2018 07:42    Cardiac Studies  TTE 11/02/2018  1. Left ventricular ejection fraction, by visual estimation, is 25 to 30%. The left ventricle has severely decreased function. There  is mildly increased left ventricular hypertrophy.  2. Definity contrast agent was given IV to delineate the left ventricular endocardial borders.  3. Elevated left atrial pressure.  4. Left ventricular diastolic parameters are consistent with Grade II diastolic dysfunction (pseudonormalization).  5. Severely dilated left ventricular internal cavity size.  6. Global right ventricle has normal systolic function.The right ventricular size is moderately enlarged. No increase in right ventricular wall thickness.  7. Left atrial size was severely dilated.  8. Right atrial size was moderately dilated.  9. The mitral valve is normal in structure. Trace mitral valve regurgitation. 10. The tricuspid valve is not well visualized. Tricuspid valve regurgitation is not demonstrated. 11. The aortic valve is tricuspid. Aortic valve regurgitation is not visualized.  Patient Profile  Julie Benjamin is a 48 y.o. female with uncontrolled diabetes and hypertension and obesity who was admitted on 10/31 for acute decompensated systolic heart failure.  Assessment & Plan   1.  Acute decompensated systolic heart failure, ejection fraction 25 to 30% -Likely related to uncontrolled diabetes and uncontrolled hypertension as well as morbid obesity. -We will proceed with a left and right heart catheterization today to better assess coronary arteries as well as to get an idea of what her volume status is. -Her creatinine has bumped and I have stopped her diuresis for now.  She does appear euvolemic on exam. -I will increase her carvedilol to 25 mg twice daily -I have increased her BiDil to 2 tablets of 20-37.5 mg 3 times daily.  I anticipate this transition her to an ARB tomorrow after her contrast load from cardiac cath.  I suspect insurance may be a problem for Entresto. -I also increased her Aldactone to 25 mg daily -We will hold diuresis today and likely transition her to p.o. Lasix tomorrow.  She needs extensive  counseling on diet modification. -Pending results of the above cardiac cath, will plan for medical management for 3 to 6 months and then repeat an echocardiogram to determine her candidacy for ICD therapy -She will need help with getting her diabetes medications as an outpatient.    For questions or updates, please contact [facility] Please consult [email or web address] for contact info under   Signed, Ethan T. O'Neal, MD [place]  [facility]  11/05/2018 8:00 AM   

## 2018-11-06 ENCOUNTER — Encounter (HOSPITAL_COMMUNITY): Payer: Self-pay | Admitting: Cardiovascular Disease

## 2018-11-06 LAB — BASIC METABOLIC PANEL
Anion gap: 7 (ref 5–15)
BUN: 24 mg/dL — ABNORMAL HIGH (ref 6–20)
CO2: 35 mmol/L — ABNORMAL HIGH (ref 22–32)
Calcium: 9 mg/dL (ref 8.9–10.3)
Chloride: 100 mmol/L (ref 98–111)
Creatinine, Ser: 1.3 mg/dL — ABNORMAL HIGH (ref 0.44–1.00)
GFR calc Af Amer: 57 mL/min — ABNORMAL LOW (ref >60)
GFR calc non Af Amer: 49 mL/min — ABNORMAL LOW (ref >60)
Glucose, Bld: 127 mg/dL — ABNORMAL HIGH (ref 70–99)
Potassium: 3.9 mmol/L (ref 3.5–5.1)
Sodium: 142 mmol/L (ref 135–145)

## 2018-11-06 LAB — CBC
HCT: 41.7 % (ref 36.0–46.0)
Hemoglobin: 12.8 g/dL (ref 12.0–15.0)
MCH: 29 pg (ref 26.0–34.0)
MCHC: 30.7 g/dL (ref 30.0–36.0)
MCV: 94.6 fL (ref 80.0–100.0)
Platelets: 360 10*3/uL (ref 150–400)
RBC: 4.41 MIL/uL (ref 3.87–5.11)
RDW: 15.3 % (ref 11.5–15.5)
WBC: 9.7 10*3/uL (ref 4.0–10.5)
nRBC: 0 % (ref 0.0–0.2)

## 2018-11-06 LAB — POCT I-STAT EG7
Acid-Base Excess: 7 mmol/L — ABNORMAL HIGH (ref 0.0–2.0)
Bicarbonate: 34.5 mmol/L — ABNORMAL HIGH (ref 20.0–28.0)
Calcium, Ion: 1.19 mmol/L (ref 1.15–1.40)
HCT: 40 % (ref 36.0–46.0)
Hemoglobin: 13.6 g/dL (ref 12.0–15.0)
O2 Saturation: 57 %
Potassium: 4.2 mmol/L (ref 3.5–5.1)
Sodium: 141 mmol/L (ref 135–145)
TCO2: 36 mmol/L — ABNORMAL HIGH (ref 22–32)
pCO2, Ven: 57.6 mmHg (ref 44.0–60.0)
pH, Ven: 7.385 (ref 7.250–7.430)
pO2, Ven: 31 mmHg — CL (ref 32.0–45.0)

## 2018-11-06 LAB — GLUCOSE, CAPILLARY
Glucose-Capillary: 134 mg/dL — ABNORMAL HIGH (ref 70–99)
Glucose-Capillary: 129 mg/dL — ABNORMAL HIGH (ref 70–99)
Glucose-Capillary: 145 mg/dL — ABNORMAL HIGH (ref 70–99)
Glucose-Capillary: 264 mg/dL — ABNORMAL HIGH (ref 70–99)

## 2018-11-06 MED ORDER — MAGNESIUM SULFATE IN D5W 1-5 GM/100ML-% IV SOLN
1.0000 g | Freq: Once | INTRAVENOUS | Status: AC
  Administered 2018-11-06: 05:00:00 1 g via INTRAVENOUS
  Filled 2018-11-06: qty 100

## 2018-11-06 MED ORDER — POTASSIUM CHLORIDE CRYS ER 20 MEQ PO TBCR
40.0000 meq | EXTENDED_RELEASE_TABLET | Freq: Once | ORAL | Status: AC
  Administered 2018-11-06: 40 meq via ORAL
  Filled 2018-11-06: qty 2

## 2018-11-06 MED ORDER — HYDRALAZINE HCL 50 MG PO TABS
50.0000 mg | ORAL_TABLET | Freq: Three times a day (TID) | ORAL | Status: DC
  Administered 2018-11-06 – 2018-11-07 (×3): 50 mg via ORAL
  Filled 2018-11-06 (×3): qty 1

## 2018-11-06 MED ORDER — FUROSEMIDE 10 MG/ML IJ SOLN
40.0000 mg | Freq: Once | INTRAMUSCULAR | Status: AC
  Administered 2018-11-06: 09:00:00 40 mg via INTRAVENOUS
  Filled 2018-11-06: qty 4

## 2018-11-06 MED ORDER — FUROSEMIDE 40 MG PO TABS
40.0000 mg | ORAL_TABLET | Freq: Every day | ORAL | Status: DC
  Administered 2018-11-07: 40 mg via ORAL
  Filled 2018-11-06: qty 1

## 2018-11-06 NOTE — Progress Notes (Signed)
Pt had 7 beats of V tach. Pt asymptomatic and stable. MD paged. Will continue to monitor.

## 2018-11-06 NOTE — Progress Notes (Signed)
Hospitalist progress note  If 7PM-7AM,  night-coverage-look on AMION -prefer pages-not epic chat,please  Julie Benjamin  PYP:[id number] DOB: 10/25/1970 DOA: 10/31/2018 PCP: Patient, No Pcp Per  Narrative:  48 year old Fem known asthma, DM TY 2, hypertension admitted 10/31with DOE-developed on 11/1 ??? wob, encephalopathic with elevated CO2 found to have right upper lobe infiltrate and pulmonology consulted-placed on BiPAP  cardiology was consulted as she had new onset systolic heart failure EF 25-30% on echo 11/02/2018 She was diuresed  Assessment & Plan: Acute systolic diastolic heart failure Cardiac cath 11/4 showed no Coronary obst -meds per Cardiology.  Wght from 135-->129 kg Continue coreg 25 bid, Bidil 2 tab 3 x d, aldactone 25 qd Rest per cards Cardiomyopathy As above Body mass index is 50.15 kg/m.  OSA not on CPAP Patient will need this set up on d/c-CM is aware and will coordinate Mild hypokalemia Replaced 11/4--labs stable Right upper lobe infiltrate rec'd Abx until 11/1--felt non infectious--no sputum no s/sym per patient HTN poorly controlled  Meds as above Remote RLE DVT  Will need OP follow up and monitoring CKD ii styable at this time-monitor on dirurectics and aldactone  DM ty ii a1c 8.5  cotinue SSI, CBg ranges 120-140  req only 1 Co appreciated so far  DVT prophylaxis: Lovenox  Code Status:   Full   Family Communication:   None Disposition Plan: Inpatient pending further dispo per cards  Consultants:   Cardiology  Critical care Procedures:   Echo  Cardiac cath 11/4Conclusion     Hemodynamic findings consistent with mild pulmonary hypertension.   1. No angiographic evidence of CAD 2. Elevated filling pressures. (RV 46/6/13, PA 43/20/35, PCWP21, LVEDP 20)   Recommendations: She remains mildly volume overloaded. Will give one dose of IV Lasix tonight. No further ischemic workup.     Antimicrobials:   Azithromycin ceftriaxone through  11/1 Subjective:  Awake coherent in nad no distress didn't use bipap last pm eatign drinking no cp sob fever chills overall feels improved   Objective: Vitals:   11/06/18 0447 11/06/18 0729 11/06/18 0756 11/06/18 0757  BP: (!) 155/108 (!) 162/88    Pulse: 97 96    Resp: 18 18    Temp: 97.7 F (36.5 C) 98.1 F (36.7 C)    TempSrc: Oral Oral    SpO2: 94% 97% 94% 94%  Weight:      Height:        Intake/Output Summary (Last 24 hours) at 11/06/2018 0827 Last data filed at 11/06/2018 0529 Gross per 24 hour  Intake 861.07 ml  Output 2900 ml  Net -2038.93 ml   Filed Weights   11/04/18 0749 11/05/18 0300 11/06/18 0442  Weight: 130.7 kg 129.3 kg 128.4 kg    Examination: eomim ncat obese  Mod dentition Cannot asses JVD s1 s 2no m/r/g abd obese Trace pedal edema stg I Neuro intact  Data Reviewed: I have personally reviewed following labs and imaging studies CBC: Recent Labs  Lab 10/31/18 1947 11/02/18 0506 11/03/18 0259 11/04/18 0401 11/05/18 0312 11/05/18 1557 11/06/18 0420  WBC 10.7* 13.7* 10.7* 10.0 10.3  --  9.7  NEUTROABS 9.0*  --   --   --  8.0*  --   --   HGB 12.5 12.9 12.0 12.9 12.9 13.3 12.8  HCT 41.6 43.3 40.5 43.3 41.9 39.0 41.7  MCV 97.7 98.6 98.1 97.3 94.6  --  94.6  PLT 366 424* 383 374 381  --  245   Basic Metabolic Panel: Recent Labs  Lab 11/01/18 0301 11/02/18 0506 11/03/18 0259 11/04/18 0401 11/05/18 0312 11/05/18 1557 11/06/18 0420  NA  --  141 143 143 138 143 142  K  --  4.0 3.2* 3.8 3.3* 3.8 3.9  CL  --  100 96* 93* 91*  --  100  CO2  --  30 39* 38* 36*  --  35*  GLUCOSE  --  124* 154* 109* 120*  --  127*  BUN  --  26* 20 17 21*  --  24*  CREATININE  --  1.26* 1.31* 1.10* 1.29*  --  1.30*  CALCIUM  --  8.9 8.5* 9.2 9.1  --  9.0  MG 1.8 2.0  --   --  1.8  --   --   PHOS  --   --   --   --  2.6  --   --    GFR: Estimated Creatinine Clearance: 69.9 mL/min (A) (by C-G formula based on SCr of 1.3 mg/dL (H)). Liver Function  Tests: Recent Labs  Lab 10/31/18 1947  AST 13*  ALT 23  ALKPHOS 72  BILITOT 0.5  PROT 6.9  ALBUMIN 3.3*   No results for input(s): LIPASE, AMYLASE in the last 168 hours. No results for input(s): AMMONIA in the last 168 hours. Coagulation Profile: Recent Labs  Lab 11/05/18 0312  INR 1.0   Cardiac Enzymes: Radiology Studies: Reviewed images personally in health database  Scheduled Meds: . budesonide (PULMICORT) nebulizer solution  0.5 mg Nebulization BID  . carvedilol  25 mg Oral BID WC  . Chlorhexidine Gluconate Cloth  6 each Topical Daily  . enoxaparin (LOVENOX) injection  40 mg Subcutaneous Q24H  . famotidine  20 mg Oral Daily  . furosemide  40 mg Intravenous Once  . insulin aspart  0-9 Units Subcutaneous TID WC  . ipratropium-albuterol  3 mL Nebulization BID  . losartan  100 mg Oral Daily  . mouth rinse  15 mL Mouth Rinse BID  . montelukast  10 mg Oral QHS  . sodium chloride flush  3 mL Intravenous Q12H  . sodium chloride flush  3 mL Intravenous Q12H  . sodium chloride flush  3 mL Intravenous Q12H  . spironolactone  25 mg Oral Daily   Continuous Infusions: . sodium chloride Stopped (11/03/18 1301)  . sodium chloride      LOS: 5 days   Time spent: 40 Pleas Koch, MD [facility]  11/06/2018, 8:27 AM

## 2018-11-06 NOTE — Progress Notes (Signed)
Cardiology Progress Note  Patient ID: Julie Benjamin MRN: [id number] DOB: 12-24-1970 Date of Encounter: 11/06/2018  Primary Cardiologist: Reatha Harps, MD  Subjective  Normal coronaries on cardiac cath yesterday.  LVEDP was elevated 20 and she is received 40 mg of IV Lasix yesterday evening and this morning.  Overall edema is much improved and she is breathing well.  Transition to losartan today.  ROS:  All other ROS reviewed and negative. Pertinent positives noted in the HPI.     Inpatient Medications  Scheduled Meds: . budesonide (PULMICORT) nebulizer solution  0.5 mg Nebulization BID  . carvedilol  25 mg Oral BID WC  . Chlorhexidine Gluconate Cloth  6 each Topical Daily  . enoxaparin (LOVENOX) injection  40 mg Subcutaneous Q24H  . famotidine  20 mg Oral Daily  . insulin aspart  0-9 Units Subcutaneous TID WC  . ipratropium-albuterol  3 mL Nebulization BID  . losartan  100 mg Oral Daily  . mouth rinse  15 mL Mouth Rinse BID  . montelukast  10 mg Oral QHS  . sodium chloride flush  3 mL Intravenous Q12H  . sodium chloride flush  3 mL Intravenous Q12H  . sodium chloride flush  3 mL Intravenous Q12H  . spironolactone  25 mg Oral Daily   Continuous Infusions: . sodium chloride Stopped (11/03/18 1301)  . sodium chloride     PRN Meds: sodium chloride, sodium chloride, acetaminophen, albuterol, ondansetron (ZOFRAN) IV, ondansetron **OR** [DISCONTINUED] ondansetron (ZOFRAN) IV, polyethylene glycol, sodium chloride flush, sodium chloride flush   Vital Signs   Vitals:   11/06/18 0447 11/06/18 0729 11/06/18 0756 11/06/18 0757  BP: (!) 155/108 (!) 162/88    Pulse: 97 96    Resp: 18 18    Temp: 97.7 F (36.5 C) 98.1 F (36.7 C)    TempSrc: Oral Oral    SpO2: 94% 97% 94% 94%  Weight:      Height:        Intake/Output Summary (Last 24 hours) at 11/06/2018 0939 Last data filed at 11/06/2018 0900 Gross per 24 hour  Intake 1101.07 ml  Output 2600 ml  Net -1498.93 ml   Last  3 Weights 11/06/2018 11/05/2018 11/04/2018  Weight (lbs) 283 lb 1.6 oz 285 lb 288 lb 3.2 oz  Weight (kg) 128.413 kg 129.275 kg 130.727 kg      Telemetry  Overnight telemetry shows sinus tachycardia with heart rate in the low 90s, which I personally reviewed.   ECG  The most recent ECG shows sinus tachycardia with LVH, which I personally reviewed.   Physical Exam   Vitals:   11/06/18 0447 11/06/18 0729 11/06/18 0756 11/06/18 0757  BP: (!) 155/108 (!) 162/88    Pulse: 97 96    Resp: 18 18    Temp: 97.7 F (36.5 C) 98.1 F (36.7 C)    TempSrc: Oral Oral    SpO2: 94% 97% 94% 94%  Weight:      Height:         Intake/Output Summary (Last 24 hours) at 11/06/2018 0939 Last data filed at 11/06/2018 0900 Gross per 24 hour  Intake 1101.07 ml  Output 2600 ml  Net -1498.93 ml    Last 3 Weights 11/06/2018 11/05/2018 11/04/2018  Weight (lbs) 283 lb 1.6 oz 285 lb 288 lb 3.2 oz  Weight (kg) 128.413 kg 129.275 kg 130.727 kg    Body mass index is 50.15 kg/m.  General: Obese female no acute distress Head: Atraumatic, normal size  Eyes:  PEERLA, EOMI  Neck: Supple, JVD difficult to assess due to neck adiposity Endocrine: No thryomegaly Cardiac: Tachycardia noted, no murmurs rubs or gallops Lungs: Lungs appear to be clear Abd: Soft, nontender, no hepatomegaly  Ext: No edema, right femoral cath site clean and dry without signs of infection, normal pulses, no evidence of hematoma and no bruit present on examination Musculoskeletal: No deformities, BUE and BLE strength normal and equal Skin: Warm and dry, no rashes   Neuro: Alert and oriented to person, place, time, and situation, CNII-XII grossly intact, no focal deficits  Psych: Normal mood and affect   Labs  High Sensitivity Troponin:   Recent Labs  Lab 11/01/18 0149 11/01/18 0301  TROPONINIHS 85* 81*     Cardiac EnzymesNo results for input(s): TROPONINI in the last 168 hours. No results for input(s): TROPIPOC in the last 168 hours.   Chemistry Recent Labs  Lab 10/31/18 1947  11/04/18 0401 11/05/18 0312 11/05/18 1557 11/05/18 1601 11/06/18 0420  NA 142   < > 143 138 143 141 142  K 3.4*   < > 3.8 3.3* 3.8 4.2 3.9  CL 101   < > 93* 91*  --   --  100  CO2 31   < > 38* 36*  --   --  35*  GLUCOSE 120*   < > 109* 120*  --   --  127*  BUN 21*   < > 17 21*  --   --  24*  CREATININE 1.45*   < > 1.10* 1.29*  --   --  1.30*  CALCIUM 8.9   < > 9.2 9.1  --   --  9.0  PROT 6.9  --   --   --   --   --   --   ALBUMIN 3.3*  --   --   --   --   --   --   AST 13*  --   --   --   --   --   --   ALT 23  --   --   --   --   --   --   ALKPHOS 72  --   --   --   --   --   --   BILITOT 0.5  --   --   --   --   --   --   GFRNONAA 43*   < > 60* 49*  --   --  49*  GFRAA 50*   < > >60 57*  --   --  57*  ANIONGAP 10   < > 12 11  --   --  7   < > = values in this interval not displayed.    Hematology Recent Labs  Lab 11/04/18 0401 11/05/18 0312 11/05/18 1557 11/05/18 1601 11/06/18 0420  WBC 10.0 10.3  --   --  9.7  RBC 4.45 4.43  --   --  4.41  HGB 12.9 12.9 13.3 13.6 12.8  HCT 43.3 41.9 39.0 40.0 41.7  MCV 97.3 94.6  --   --  94.6  MCH 29.0 29.1  --   --  29.0  MCHC 29.8* 30.8  --   --  30.7  RDW 15.5 15.2  --   --  15.3  PLT 374 381  --   --  360   BNP Recent Labs  Lab 10/31/18 1947  BNP 931.6*    DDimer No results  for input(s): DDIMER in the last 168 hours.   Radiology  Dg Chest Port 1 View  Result Date: 11/05/2018 CLINICAL DATA:  Shortness of breath, respiratory failure. EXAM: PORTABLE CHEST 1 VIEW COMPARISON:  11/02/2018 and 10/31/2018 FINDINGS: Lungs are adequately inflated with resolution of the previously seen airspace process over the right upper lobe. Mild vertical linear density left retrocardiac region unchanged likely atelectasis. Moderate stable cardiomegaly. Remainder the exam is unchanged. IMPRESSION: Resolution of previously seen right upper lobe airspace process. Linear atelectasis left retrocardiac  region. Moderate stable cardiomegaly. Electronically Signed   By: Elberta Fortis M.D.   On: 11/05/2018 07:42    Cardiac Studies  TTE 11/02/2018  1. Left ventricular ejection fraction, by visual estimation, is 25 to 30%. The left ventricle has severely decreased function. There is mildly increased left ventricular hypertrophy.  2. Definity contrast agent was given IV to delineate the left ventricular endocardial borders.  3. Elevated left atrial pressure.  4. Left ventricular diastolic parameters are consistent with Grade II diastolic dysfunction (pseudonormalization).  5. Severely dilated left ventricular internal cavity size.  6. Global right ventricle has normal systolic function.The right ventricular size is moderately enlarged. No increase in right ventricular wall thickness.  7. Left atrial size was severely dilated.  8. Right atrial size was moderately dilated.  9. The mitral valve is normal in structure. Trace mitral valve regurgitation. 10. The tricuspid valve is not well visualized. Tricuspid valve regurgitation is not demonstrated. 11. The aortic valve is tricuspid. Aortic valve regurgitation is not visualized.  LHC/RHC  1. No angiographic evidence of CAD 2. Elevated filling pressures. (RV 46/6/13, PA 43/20/35, XHBZ16, LVEDP 20)  Patient Profile  Julie Benjamin is a 48 y.o. female with uncontrolled diabetes and hypertension and obesity who was admitted on 10/31 for acute decompensated systolic heart failure.  Assessment & Plan   1.  Acute decompensated systolic heart failure, ejection fraction 25 to 30% -Likely related to uncontrolled diabetes and uncontrolled hypertension as well as morbid obesity.  Normal coronaries on cardiac cath. -Her LVEDP was around 20 mmHg and she was given 40 mg of IV Lasix yesterday evening I will give her 40 mg of IV Lasix morning -We will transition her to 40 mg of p.o. Lasix daily tomorrow -We will continue her carvedilol 25 mg twice daily, I have  added losartan 100 mg daily, and we will continue her Aldactone 25 mg daily -I would prefer Entresto however she has no insurance we will have to wait on this -We will also add hydralazine 100 mg 3 times daily as her blood pressure been difficult to control -will plan for medical management for 3 to 6 months and then repeat an echocardiogram to determine her candidacy for ICD therapy -She will need help with getting her diabetes medications as an outpatient.    We will see her tomorrow for final recommendations and arrange follow-up with her in 1 to 2 weeks in our clinic.  For questions or updates, please contact [facility] Please consult [email or web address] for contact info under   Signed, Gerri Spore T. Flora Lipps, MD [facility]  [facility]  11/06/2018 9:39 AM

## 2018-11-06 NOTE — Progress Notes (Signed)
Pt continues to refuse BiPAP

## 2018-11-07 ENCOUNTER — Telehealth: Payer: Self-pay

## 2018-11-07 LAB — BASIC METABOLIC PANEL
Anion gap: 9 (ref 5–15)
BUN: 31 mg/dL — ABNORMAL HIGH (ref 6–20)
CO2: 32 mmol/L (ref 22–32)
Calcium: 9 mg/dL (ref 8.9–10.3)
Chloride: 101 mmol/L (ref 98–111)
Creatinine, Ser: 1.37 mg/dL — ABNORMAL HIGH (ref 0.44–1.00)
GFR calc Af Amer: 53 mL/min — ABNORMAL LOW (ref >60)
GFR calc non Af Amer: 46 mL/min — ABNORMAL LOW (ref >60)
Glucose, Bld: 116 mg/dL — ABNORMAL HIGH (ref 70–99)
Potassium: 4 mmol/L (ref 3.5–5.1)
Sodium: 142 mmol/L (ref 135–145)

## 2018-11-07 LAB — CBC WITH DIFFERENTIAL/PLATELET
Abs Immature Granulocytes: 0.06 10*3/uL (ref 0.00–0.07)
Basophils Absolute: 0.1 10*3/uL (ref 0.0–0.1)
Basophils Relative: 1 %
Eosinophils Absolute: 0 10*3/uL (ref 0.0–0.5)
Eosinophils Relative: 0 %
HCT: 41.7 % (ref 36.0–46.0)
Hemoglobin: 12.4 g/dL (ref 12.0–15.0)
Immature Granulocytes: 1 %
Lymphocytes Relative: 14 %
Lymphs Abs: 1.4 10*3/uL (ref 0.7–4.0)
MCH: 28.8 pg (ref 26.0–34.0)
MCHC: 29.7 g/dL — ABNORMAL LOW (ref 30.0–36.0)
MCV: 96.8 fL (ref 80.0–100.0)
Monocytes Absolute: 0.7 10*3/uL (ref 0.1–1.0)
Monocytes Relative: 6 %
Neutro Abs: 8.3 10*3/uL — ABNORMAL HIGH (ref 1.7–7.7)
Neutrophils Relative %: 78 %
Platelets: 361 10*3/uL (ref 150–400)
RBC: 4.31 MIL/uL (ref 3.87–5.11)
RDW: 15.4 % (ref 11.5–15.5)
WBC: 10.5 10*3/uL (ref 4.0–10.5)
nRBC: 0 % (ref 0.0–0.2)

## 2018-11-07 LAB — GLUCOSE, CAPILLARY: Glucose-Capillary: 112 mg/dL — ABNORMAL HIGH (ref 70–99)

## 2018-11-07 MED ORDER — FUROSEMIDE 40 MG PO TABS: 40.0000 mg | ORAL_TABLET | Freq: Every day | ORAL | 3 refills | Status: DC

## 2018-11-07 MED ORDER — ALBUTEROL SULFATE HFA 108 (90 BASE) MCG/ACT IN AERS: 1.0000 | INHALATION_SPRAY | Freq: Four times a day (QID) | RESPIRATORY_TRACT | 12 refills | Status: DC | PRN

## 2018-11-07 MED ORDER — LOSARTAN POTASSIUM 100 MG PO TABS: 100.0000 mg | ORAL_TABLET | Freq: Every day | ORAL | 3 refills | Status: DC

## 2018-11-07 MED ORDER — GLIPIZIDE 5 MG PO TABS: 5.0000 mg | ORAL_TABLET | Freq: Two times a day (BID) | ORAL | 11 refills | Status: DC

## 2018-11-07 MED ORDER — SPIRONOLACTONE 25 MG PO TABS: 25.0000 mg | ORAL_TABLET | Freq: Every day | ORAL | 3 refills | Status: DC

## 2018-11-07 MED ORDER — IPRATROPIUM-ALBUTEROL 0.5-2.5 (3) MG/3ML IN SOLN: 3.0000 mL | Freq: Four times a day (QID) | RESPIRATORY_TRACT | 12 refills | Status: DC | PRN

## 2018-11-07 MED ORDER — ALBUTEROL SULFATE (2.5 MG/3ML) 0.083% IN NEBU: 2.5000 mg | INHALATION_SOLUTION | Freq: Four times a day (QID) | RESPIRATORY_TRACT | 12 refills | Status: DC | PRN

## 2018-11-07 MED ORDER — HYDRALAZINE HCL 50 MG PO TABS: 50.0000 mg | ORAL_TABLET | Freq: Three times a day (TID) | ORAL | 3 refills | Status: DC

## 2018-11-07 MED ORDER — MONTELUKAST SODIUM 10 MG PO TABS: 10.0000 mg | ORAL_TABLET | Freq: Every day | ORAL | 12 refills | Status: DC

## 2018-11-07 MED ORDER — BUDESONIDE-FORMOTEROL FUMARATE 160-4.5 MCG/ACT IN AERO: 2.0000 | INHALATION_SPRAY | Freq: Two times a day (BID) | RESPIRATORY_TRACT | 12 refills | Status: DC

## 2018-11-07 MED ORDER — CARVEDILOL 25 MG PO TABS: 25.0000 mg | ORAL_TABLET | Freq: Two times a day (BID) | ORAL | 3 refills | Status: DC

## 2018-11-07 MED FILL — glipiZIDE 5 MG TABS: 5 | 30 days supply | Qty: 60 | Fill #0

## 2018-11-07 MED FILL — LOSARTAN POTASSIUM 100 MG T: 100 | 30 days supply | Qty: 30 | Fill #0

## 2018-11-07 MED FILL — SPIRONOLACTONE 25 MG TABLET: 25 | 30 days supply | Qty: 30 | Fill #0

## 2018-11-07 MED FILL — VENTOLIN HFA 90 MCG INHALER: 108 (90 BAS | 20 days supply | Qty: 18 | Fill #0

## 2018-11-07 MED FILL — hydrALAZINE HCL 50 MG TABS: 50 | 30 days supply | Qty: 90 | Fill #0

## 2018-11-07 MED FILL — MONTELUKAST SOD 10 MG TAB: 10 | 30 days supply | Qty: 30 | Fill #0

## 2018-11-07 MED FILL — CARVEDILOL 25 MG TABLET: 25 | 30 days supply | Qty: 60 | Fill #0

## 2018-11-07 MED FILL — FUROSEMIDE 40 MG TABLET: 40 | 30 days supply | Qty: 30 | Fill #0

## 2018-11-07 MED FILL — IPRAT-ALBUT 0.5-3(2.5) MG/3: 0.5-2.5 (3) | 30 days supply | Qty: 360 | Fill #0

## 2018-11-07 MED FILL — ALBUTEROL SUL 2.5 MG/3 ML S: (2.5 MG/3ML | 7 days supply | Qty: 90 | Fill #0

## 2018-11-07 MED FILL — SYMBICORT 160-4.5 MCG INH: 160-4.5 | 30 days supply | Qty: 10 | Fill #0

## 2018-11-07 NOTE — Discharge Summary (Signed)
Physician Discharge Summary  Julie Benjamin KXF:[id number] DOB: 02/26/1970 DOA: 10/31/2018  PCP: Patient, No Pcp Per  Admit date: 10/31/2018 Discharge date: 11/07/2018  Time spent: 35 minutes  Recommendations for Outpatient Follow-up:  1. New medications as below and have been explained to patient 2. Needs Chem-12 CBC in about 1 to 2 weeks 3. BiPAP being arranged will need outpatient titration and possible pulmonary follow-up in the outpatient setting for sleep study and I will give her the details for Dr. George HughEllison's clinic at [facility] for this to be arranged 4. Recommend medical weight loss versus gastric outlet procedure  Discharge Diagnoses:  Principal Problem:   Acute respiratory failure with hypoxia (HCC) Active Problems:   Hypertension   Diabetes mellitus without complication (HCC)   Asthma   Right upper lobe pneumonia   Renal insufficiency   History of DVT of lower extremity   NICM (nonischemic cardiomyopathy) (HCC)   Discharge Condition: Improved  Diet recommendation: Heart healthy  Filed Weights   11/05/18 0300 11/06/18 0442 11/07/18 0530  Weight: 129.3 kg 128.4 kg 129.5 kg    History of present illness:  48 year old Fem known asthma, DM TY 2, hypertension admitted 10/31with DOE-developed on 11/1 ??? wob, encephalopathic with elevated CO2 found to have right upper lobe infiltrate and pulmonology consulted-placed on BiPAP  cardiology was consulted as she had new onset systolic heart failure EF 25-30% on echo 11/02/2018 She was diuresed   Hospital Course:  Acute systolic diastolic heart failure Cardiac cath 11/4 showed no Coronary obst -meds on discharge included Coreg hydralazine Aldactone amlodipine and Lasix Outpatient follow-up needed regarding titration of meds etc. Cardiomyopathy As above Body mass index is 50.15 kg/m.  OSA not on CPAP Patient will need this set up on d/c-CM is aware and will coordinate We will arrange for outpatient sleep  study in addition to titration none management of the same with pulmonology Mild hypokalemia Replaced 11/4--labs stable Right upper lobe infiltrate rec'd Abx until 11/1--felt non infectious--no sputum no s/sym per patient and antibiotics were discontinued HTN poorly controlled               Meds as above Remote RLE DVT               Will need OP follow up and monitoring CKD ii styable at this time-monitor on dirurectics and aldactone   DM ty ii a1c 8.5               cotinue SSI, CBg ranges 120-140 Tells me on discharge that she has been taking Metformin in the outpatient setting but has not taken it in over a year her A1c is 8.5 Technically she qualifies for insulin with an A1c above 8 however she wants to try to lose some weight and attempt to use oral medication glipizide instead of Metformin I have counseled her regarding hypoglycemia  Consultants:   Cardiology  Critical care Procedures:   Echo  Cardiac cath 11/4  Conclusion    Hemodynamic findings consistent with mild pulmonary hypertension.  1. No angiographic evidence of CAD 2. Elevated filling pressures. (RV 46/6/13, PA 43/20/35, PCWP21, LVEDP 20)  Recommendations: She remains mildly volume overloaded. Will give one dose of IV Lasix tonight. No further ischemic workup.     Antimicrobials:   Azithromycin ceftriaxone through 11/   Discharge Exam: Vitals:   11/07/18 0752 11/07/18 0819  BP:  (!) 143/94  Pulse:  92  Resp:  18  Temp:  98.4 F (36.9 C)  SpO2:  96% 95%    General: Awake alert coherent no distress eating drinking ready to go home she is no fever no chills Cardiovascular: S1-S2 no murmur rub or gallop RRR Respiratory: Clinically clear no added sound Abdomen soft nontender obese no distention No lower extremity edema  Discharge Instructions   Discharge Instructions    Diet - low sodium heart healthy   Complete by: As directed    Discharge instructions   Complete by: As directed     You will notice that you have been placed on multiple new medications for your heart including Coreg hydralazine losartan and Aldactone some of these medications more than once a day make sure that you take them as directed You will also need to take Lasix 40 mg daily for fluid buildup-would recommend a very low salt and fluid restricted diet and if you gain more than 2 pounds over 48 hours would recommend that you either increase his dose by half tablet and/or call cardiology office I would recommend that you get labs at your primary care physician office in about 1 week and that you follow-up with your cardiologist as scheduled We will try to arrange a BiPAP machine for you hopefully the one that works well and this will be done prior to discharge and you will probably need to be seen by a lung doctor in addition in the outpatient setting   Increase activity slowly   Complete by: As directed      Allergies as of 11/07/2018   No Known Allergies     Medication List    TAKE these medications   albuterol 108 (90 Base) MCG/ACT inhaler Commonly known as: VENTOLIN HFA Inhale 1-2 puffs into the lungs every 6 (six) hours as needed for wheezing or shortness of breath.   albuterol (2.5 MG/3ML) 0.083% nebulizer solution Commonly known as: PROVENTIL Take 2.5 mg by nebulization every 6 (six) hours as needed for wheezing or shortness of breath.   amLODipine 10 MG tablet Commonly known as: NORVASC Take 10 mg by mouth daily.   budesonide-formoterol 160-4.5 MCG/ACT inhaler Commonly known as: SYMBICORT Inhale 2 puffs into the lungs 2 (two) times daily.   carvedilol 25 MG tablet Commonly known as: COREG Take 1 tablet (25 mg total) by mouth 2 (two) times daily with a meal.   furosemide 40 MG tablet Commonly known as: LASIX Take 1 tablet (40 mg total) by mouth daily. Start taking on: November 08, 2018   hydrALAZINE 50 MG tablet Commonly known as: APRESOLINE Take 1 tablet (50 mg total) by mouth  every 8 (eight) hours.   ipratropium-albuterol 0.5-2.5 (3) MG/3ML Soln Commonly known as: DUONEB Take 3 mLs by nebulization every 6 (six) hours as needed (for breathing).   losartan 100 MG tablet Commonly known as: COZAAR Take 1 tablet (100 mg total) by mouth daily. Start taking on: November 08, 2018   montelukast 10 MG tablet Commonly known as: SINGULAIR Take 10 mg by mouth at bedtime.   spironolactone 25 MG tablet Commonly known as: ALDACTONE Take 1 tablet (25 mg total) by mouth daily. Start taking on: November 08, 2018            Durable Medical Equipment  (From admission, onward)         Start     Ordered   11/06/18 1444  For home use only DME Bipap  Once    Question Answer Comment  Length of Need Lifetime   Bleed in oxygen (LPM) 2  Keep 02 saturation adjustments may be made by RT   Inspiratory pressure OTHER SEE COMMENTS   Expiratory pressure OTHER SEE COMMENTS      11/06/18 1443         No Known Allergies Follow-up Information    [facility]. Go on 11/18/2018.   Why: at 8:30am Contact information: [address] [place] [id number] [phone]       Coralyn HellingSood, Vineet, MD Follow up on 11/18/2018.   Specialty: Pulmonary Disease Why: Dr.Sood at 11:15 am Contact information: [address] [place] [id number] [phone]            The results of significant diagnostics from this hospitalization (including imaging, microbiology, ancillary and laboratory) are listed below for reference.    Significant Diagnostic Studies: Dg Chest 2 View  Result Date: 10/31/2018 CLINICAL DATA:  Progressive shortness of breath over several days. Asthma. EXAM: CHEST - 2 VIEW COMPARISON:  None. FINDINGS: Severe cardiomegaly is demonstrated, as well as pulmonary vascular congestion. Ill-defined area of airspace opacity is seen in the right upper lobe, suspicious for pneumonia. Tiny right pleural  effusion is also seen. Several old bilateral rib fracture deformities are noted. IMPRESSION: Right upper lobe airspace opacity, suspicious for pneumonia. Recommend chest radiographic follow-up to confirm resolution. Tiny right pleural effusion. Severe cardiomegaly with pulmonary vascular congestion. Electronically Signed   By: Danae OrleansJohn A Stahl M.D.   On: 10/31/2018 18:50   Dg Chest Port 1 View  Result Date: 11/05/2018 CLINICAL DATA:  Shortness of breath, respiratory failure. EXAM: PORTABLE CHEST 1 VIEW COMPARISON:  11/02/2018 and 10/31/2018 FINDINGS: Lungs are adequately inflated with resolution of the previously seen airspace process over the right upper lobe. Mild vertical linear density left retrocardiac region unchanged likely atelectasis. Moderate stable cardiomegaly. Remainder the exam is unchanged. IMPRESSION: Resolution of previously seen right upper lobe airspace process. Linear atelectasis left retrocardiac region. Moderate stable cardiomegaly. Electronically Signed   By: Elberta Fortisaniel  Boyle M.D.   On: 11/05/2018 07:42   Dg Chest Port 1 View  Result Date: 11/02/2018 CLINICAL DATA:  Shortness of breath. EXAM: PORTABLE CHEST 1 VIEW COMPARISON:  Chest x-ray dated 10/31/2018. FINDINGS: Improved aeration within the RIGHT upper lobe suggesting a resolving pneumonia or resolving asymmetric pulmonary edema. Lungs otherwise clear, although the LEFT lower lung is obscured by the overlying heart shadow. Stable marked cardiomegaly. No acute appearing osseous abnormality. IMPRESSION: 1. Improved aeration within the RIGHT upper lobe suggesting resolving pneumonia or resolving edema. 2. Stable marked cardiomegaly. No evidence of associated CHF at this time. Electronically Signed   By: Bary RichardStan  Maynard M.D.   On: 11/02/2018 13:27    Microbiology: Recent Results (from the past 240 hour(s))  SARS CORONAVIRUS 2 (TAT 6-24 HRS) Nasopharyngeal Nasopharyngeal Swab     Status: None   Collection Time: 11/01/18  1:56 AM    Specimen: Nasopharyngeal Swab  Result Value Ref Range Status   SARS Coronavirus 2 NEGATIVE NEGATIVE Final    Comment: (NOTE) SARS-CoV-2 target nucleic acids are NOT DETECTED. The SARS-CoV-2 RNA is generally detectable in upper and lower respiratory specimens during the acute phase of infection. Negative results do not preclude SARS-CoV-2 infection, do not rule out co-infections with other pathogens, and should not be used as the sole basis for treatment or other patient management decisions. Negative results must be combined with clinical observations, patient history, and epidemiological information. The expected result is Negative. Fact Sheet for Patients: HairSlick.nohttps://www.fda.gov/media/138098/download Fact Sheet for Healthcare  Providers: https://www.woods-mathews.com/ This test is not yet approved or cleared by the [place] and  has been authorized for detection and/or diagnosis of SARS-CoV-2 by FDA under an Emergency Use Authorization (EUA). This EUA will remain  in effect (meaning this test can be used) for the duration of the COVID-19 declaration under Section 56 4(b)(1) of the Act, 21 U.S.C. section 360bbb-3(b)(1), unless the authorization is terminated or revoked sooner. Performed at [facility], [place] [address] [place], [place] [address]   MRSA PCR Screening     Status: None   Collection Time: 11/02/18  6:37 PM   Specimen: Nasal Mucosa; Nasopharyngeal  Result Value Ref Range Status   MRSA by PCR NEGATIVE NEGATIVE Final    Comment:        The GeneXpert MRSA Assay (FDA approved for NASAL specimens only), is one component of a comprehensive MRSA colonization surveillance program. It is not intended to diagnose MRSA infection nor to guide or monitor treatment for MRSA infections. Performed at [facility], [place] [address] [place], [place] [address]      Labs: Basic Metabolic Panel: Recent Labs  Lab 11/01/18 0301 11/02/18 0506  11/03/18 0259 11/04/18 0401 11/05/18 0312 11/05/18 1557 11/05/18 1601 11/06/18 0420 11/07/18 0525  NA  --  141 143 143 138 143 141 142 142  K  --  4.0 3.2* 3.8 3.3* 3.8 4.2 3.9 4.0  CL  --  100 96* 93* 91*  --   --  100 101  CO2  --  30 39* 38* 36*  --   --  35* 32  GLUCOSE  --  124* 154* 109* 120*  --   --  127* 116*  BUN  --  26* 20 17 21*  --   --  24* 31*  CREATININE  --  1.26* 1.31* 1.10* 1.29*  --   --  1.30* 1.37*  CALCIUM  --  8.9 8.5* 9.2 9.1  --   --  9.0 9.0  MG 1.8 2.0  --   --  1.8  --   --   --   --   PHOS  --   --   --   --  2.6  --   --   --   --    Liver Function Tests: Recent Labs  Lab 10/31/18 1947  AST 13*  ALT 23  ALKPHOS 72  BILITOT 0.5  PROT 6.9  ALBUMIN 3.3*   No results for input(s): LIPASE, AMYLASE in the last 168 hours. No results for input(s): AMMONIA in the last 168 hours. CBC: Recent Labs  Lab 10/31/18 1947  11/03/18 0259 11/04/18 0401 11/05/18 0312 11/05/18 1557 11/05/18 1601 11/06/18 0420 11/07/18 0525  WBC 10.7*   < > 10.7* 10.0 10.3  --   --  9.7 10.5  NEUTROABS 9.0*  --   --   --  8.0*  --   --   --  8.3*  HGB 12.5   < > 12.0 12.9 12.9 13.3 13.6 12.8 12.4  HCT 41.6   < > 40.5 43.3 41.9 39.0 40.0 41.7 41.7  MCV 97.7   < > 98.1 97.3 94.6  --   --  94.6 96.8  PLT 366   < > 383 374 381  --   --  360 361   < > = values in this interval not displayed.   Cardiac Enzymes: No results for input(s): CKTOTAL, CKMB, CKMBINDEX, TROPONINI in the  last 168 hours. BNP: BNP (last 3 results) Recent Labs    10/31/18 1947  BNP 931.6*    ProBNP (last 3 results) No results for input(s): PROBNP in the last 8760 hours.  CBG: Recent Labs  Lab 11/06/18 0558 11/06/18 1139 11/06/18 1631 11/06/18 2112 11/07/18 0616  GLUCAP 134* 129* 145* 264* 112*       Signed:  Rhetta Mura MD   [facility] 11/07/2018, 8:51 AM

## 2018-11-07 NOTE — Plan of Care (Signed)
  Problem: Education: Goal: Ability to demonstrate management of disease process will improve 11/07/2018 0943 by Arlina Robes, RN Outcome: Adequate for Discharge 11/07/2018 [phone] by Arlina Robes, RN Outcome: Adequate for Discharge 11/07/2018 [phone] by Arlina Robes, RN Outcome: Adequate for Discharge Goal: Ability to verbalize understanding of medication therapies will improve 11/07/2018 0943 by Arlina Robes, RN Outcome: Adequate for Discharge 11/07/2018 0943 by Arlina Robes, RN Outcome: Adequate for Discharge 11/07/2018 0943 by Arlina Robes, RN Outcome: Adequate for Discharge Goal: Individualized Educational Video(s) 11/07/2018 0943 by Arlina Robes, RN Outcome: Adequate for Discharge 11/07/2018 0943 by Arlina Robes, RN Outcome: Adequate for Discharge 11/07/2018 [phone] by Arlina Robes, RN Outcome: Adequate for Discharge

## 2018-11-07 NOTE — Plan of Care (Signed)
  Problem: Education: Goal: Ability to demonstrate management of disease process will improve 11/07/2018 0943 by Arlina Robes, RN Outcome: Adequate for Discharge 11/07/2018 [phone] by Arlina Robes, RN Outcome: Adequate for Discharge 11/07/2018 [phone] by Arlina Robes, RN Outcome: Adequate for Discharge 11/07/2018 [phone] by Arlina Robes, RN Outcome: Adequate for Discharge Goal: Ability to verbalize understanding of medication therapies will improve 11/07/2018 0943 by Arlina Robes, RN Outcome: Adequate for Discharge 11/07/2018 0943 by Arlina Robes, RN Outcome: Adequate for Discharge 11/07/2018 0943 by Arlina Robes, RN Outcome: Adequate for Discharge 11/07/2018 0943 by Arlina Robes, RN Outcome: Adequate for Discharge Goal: Individualized Educational Video(s) 11/07/2018 0943 by Arlina Robes, RN Outcome: Adequate for Discharge 11/07/2018 0943 by Arlina Robes, RN Outcome: Adequate for Discharge 11/07/2018 0943 by Arlina Robes, RN Outcome: Adequate for Discharge 11/07/2018 [phone] by Arlina Robes, RN Outcome: Adequate for Discharge

## 2018-11-07 NOTE — TOC Transition Note (Signed)
Transition of Care [facility]) - CM/SW Discharge Note   Patient Details  Name: Julie Benjamin MRN: [id number] Date of Birth: 1970/04/20  Transition of Care [facility]) CM/SW Contact:  Zenon Mayo, RN Phone Number: 11/07/2018, 9:29 AM   Clinical Narrative:    Patient for dc today, will receive bipap today thru [employer] thru [facility],  Respiratory will be here today to see patient and show her how to work the bipap and give her the supplies.  Patient will also have [employer] thru [facility] for charity care. TOC pharmacy will be filling her medications and NCM assisted with Match Letter.     Final next level of care: [place] Barriers to Discharge: No Barriers Identified   Patient Goals and CMS Choice Patient states their goals for this hospitalization and ongoing recovery are:: go home CMS Medicare.gov Compare Post Acute Care list provided to:: Patient Choice offered to / list presented to : Patient  Discharge Placement                       Discharge Plan and Services In-house Referral: Financial Counselor Discharge Planning Services: CM Consult Post Acute Care Choice: Durable Medical Equipment, Home Health          DME Arranged: Bipap DME Agency: [place] Date DME Agency Contacted: 11/06/18 Time DME Agency Contacted: 6 Representative spoke with at DME Agency: Suanne Marker HH Arranged: RN, PT, Disease Management [place] Agency: [facility] [place] Date [place]: 11/06/18 Time [place]: [phone] Representative spoke with at [place]: Cassie  Social Determinants of Health ([place]) Interventions     Readmission Risk Interventions No flowsheet data found.

## 2018-11-07 NOTE — Progress Notes (Signed)
Cardiology Progress Note  Patient ID: Julie Benjamin MRN: [id number] DOB: [birth year] Date of Encounter: 11/07/2018  Primary Cardiologist: Reatha Harps, MD  Subjective  Doing well this AM.  Euvolemic on my examination.  She reports her legs have never been this skinny.  ROS:  All other ROS reviewed and negative. Pertinent positives noted in the HPI.     Inpatient Medications  Scheduled Meds:  budesonide (PULMICORT) nebulizer solution  0.5 mg Nebulization BID   carvedilol  25 mg Oral BID WC   Chlorhexidine Gluconate Cloth  6 each Topical Daily   enoxaparin (LOVENOX) injection  40 mg Subcutaneous Q24H   famotidine  20 mg Oral Daily   furosemide  40 mg Oral Daily   hydrALAZINE  50 mg Oral Q8H   insulin aspart  0-9 Units Subcutaneous TID WC   ipratropium-albuterol  3 mL Nebulization BID   losartan  100 mg Oral Daily   mouth rinse  15 mL Mouth Rinse BID   montelukast  10 mg Oral QHS   spironolactone  25 mg Oral Daily   Continuous Infusions:  sodium chloride Stopped (11/03/18 1301)   sodium chloride     PRN Meds: sodium chloride, sodium chloride, acetaminophen, albuterol, ondansetron **OR** [DISCONTINUED] ondansetron (ZOFRAN) IV, polyethylene glycol, sodium chloride flush   Vital Signs   Vitals:   11/07/18 0103 11/07/18 0530 11/07/18 0752 11/07/18 0819  BP: (!) 134/91 (!) 147/105  (!) 143/94  Pulse: 78 82  92  Resp: 18 20  18   Temp: (!) 97.5 F (36.4 C) 99 F (37.2 C)  98.4 F (36.9 C)  TempSrc: Oral Oral  Oral  SpO2: 99% 97% 96% 95%  Weight:  129.5 kg    Height:        Intake/Output Summary (Last 24 hours) at 11/07/2018 0832 Last data filed at 11/07/2018 0815 Gross per 24 hour  Intake 1422 ml  Output 1850 ml  Net -428 ml   Last 3 Weights 11/07/2018 11/06/2018 11/05/2018  Weight (lbs) 285 lb 6.4 oz 283 lb 1.6 oz 285 lb  Weight (kg) 129.457 kg 128.413 kg 129.275 kg      Telemetry  Overnight telemetry shows sinus tachycardia with heart rate in the  90s, which I personally reviewed.   ECG  The most recent ECG shows sinus tachycardia with LVH, which I personally reviewed.   Physical Exam   Vitals:   11/07/18 0103 11/07/18 0530 11/07/18 0752 11/07/18 0819  BP: (!) 134/91 (!) 147/105  (!) 143/94  Pulse: 78 82  92  Resp: 18 20  18   Temp: (!) 97.5 F (36.4 C) 99 F (37.2 C)  98.4 F (36.9 C)  TempSrc: Oral Oral  Oral  SpO2: 99% 97% 96% 95%  Weight:  129.5 kg    Height:         Intake/Output Summary (Last 24 hours) at 11/07/2018 0832 Last data filed at 11/07/2018 0815 Gross per 24 hour  Intake 1422 ml  Output 1850 ml  Net -428 ml    Last 3 Weights 11/07/2018 11/06/2018 11/05/2018  Weight (lbs) 285 lb 6.4 oz 283 lb 1.6 oz 285 lb  Weight (kg) 129.457 kg 128.413 kg 129.275 kg    Body mass index is 50.56 kg/m.  General: Obese female no acute distress Head: Atraumatic, normal size  Eyes: PEERLA, EOMI  Neck: Supple, JVD difficult to assess due to neck adiposity Endocrine: No thryomegaly Cardiac: Normal sinus rhythm, no murmurs rubs or gallops Lungs: Lungs appear to be clear  Abd: Soft, nontender, no hepatomegaly  Ext: No edema, right femoral cath site clean and dry without signs of infection, normal pulses, no evidence of hematoma and no bruit present on examination Musculoskeletal: No deformities, BUE and BLE strength normal and equal Skin: Warm and dry, no rashes   Neuro: Alert and oriented to person, place, time, and situation, CNII-XII grossly intact, no focal deficits  Psych: Normal mood and affect   Labs  High Sensitivity Troponin:   Recent Labs  Lab 11/01/18 0149 11/01/18 0301  TROPONINIHS 85* 81*     Cardiac EnzymesNo results for input(s): TROPONINI in the last 168 hours. No results for input(s): TROPIPOC in the last 168 hours.  Chemistry Recent Labs  Lab 10/31/18 1947  11/05/18 0312  11/05/18 1601 11/06/18 0420 11/07/18 0525  NA 142   < > 138   < > 141 142 142  K 3.4*   < > 3.3*   < > 4.2 3.9 4.0  CL 101    < > 91*  --   --  100 101  CO2 31   < > 36*  --   --  35* 32  GLUCOSE 120*   < > 120*  --   --  127* 116*  BUN 21*   < > 21*  --   --  24* 31*  CREATININE 1.45*   < > 1.29*  --   --  1.30* 1.37*  CALCIUM 8.9   < > 9.1  --   --  9.0 9.0  PROT 6.9  --   --   --   --   --   --   ALBUMIN 3.3*  --   --   --   --   --   --   AST 13*  --   --   --   --   --   --   ALT 23  --   --   --   --   --   --   ALKPHOS 72  --   --   --   --   --   --   BILITOT 0.5  --   --   --   --   --   --   GFRNONAA 43*   < > 49*  --   --  49* 46*  GFRAA 50*   < > 57*  --   --  57* 53*  ANIONGAP 10   < > 11  --   --  7 9   < > = values in this interval not displayed.    Hematology Recent Labs  Lab 11/05/18 0312  11/05/18 1601 11/06/18 0420 11/07/18 0525  WBC 10.3  --   --  9.7 10.5  RBC 4.43  --   --  4.41 4.31  HGB 12.9   < > 13.6 12.8 12.4  HCT 41.9   < > 40.0 41.7 41.7  MCV 94.6  --   --  94.6 96.8  MCH 29.1  --   --  29.0 28.8  MCHC 30.8  --   --  30.7 29.7*  RDW 15.2  --   --  15.3 15.4  PLT 381  --   --  360 361   < > = values in this interval not displayed.   BNP Recent Labs  Lab 10/31/18 1947  BNP 931.6*    DDimer No results for input(s): DDIMER in the last 168 hours.  Radiology  No results found.  Cardiac Studies  TTE 11/02/2018  1. Left ventricular ejection fraction, by visual estimation, is 25 to 30%. The left ventricle has severely decreased function. There is mildly increased left ventricular hypertrophy.  2. Definity contrast agent was given IV to delineate the left ventricular endocardial borders.  3. Elevated left atrial pressure.  4. Left ventricular diastolic parameters are consistent with Grade II diastolic dysfunction (pseudonormalization).  5. Severely dilated left ventricular internal cavity size.  6. Global right ventricle has normal systolic function.The right ventricular size is moderately enlarged. No increase in right ventricular wall thickness.  7. Left atrial  size was severely dilated.  8. Right atrial size was moderately dilated.  9. The mitral valve is normal in structure. Trace mitral valve regurgitation. 10. The tricuspid valve is not well visualized. Tricuspid valve regurgitation is not demonstrated. 11. The aortic valve is tricuspid. Aortic valve regurgitation is not visualized.  LHC/RHC  1. No angiographic evidence of CAD 2. Elevated filling pressures. (RV 46/6/13, PA 43/20/35, GXQJ19, LVEDP 20)  Patient Profile  Julie Benjamin is a 48 y.o. female with uncontrolled diabetes and hypertension and obesity who was admitted on 10/31 for acute decompensated systolic heart failure.  Assessment & Plan   1.  Acute decompensated systolic heart failure, ejection fraction 25 to 30% -Likely related to uncontrolled diabetes and uncontrolled hypertension as well as morbid obesity.  Normal coronaries on cardiac cath. -Transition to oral Lasix today, and continue 40 mg p.o. on discharge -We will continue her carvedilol 25 mg twice daily, I have added losartan 100 mg daily, and we will continue her Aldactone 25 mg daily -I would prefer Entresto however she has no insurance we will have to wait on this -Would also continue hydralazine 50 mg 3 times daily for better blood pressure control -will plan for medical management for 3 to 6 months and then repeat an echocardiogram to determine her candidacy for ICD therapy -She will need help with getting her diabetes medications as an outpatient.    [facility] will sign off.   Medication Recommendations: Carvedilol 25 mg twice daily, losartan 100 mg daily, Aldactone 25 mg daily, hydralazine 50 mg 3 times daily, Lasix 40 mg p.o. daily Other recommendations (labs, testing, etc): None Follow up as an outpatient: We will arrange a 1 week hospital follow-up appointment in our APP clinic and she will see me in 2 to 4 weeks  Signed, [place] T. Audie Box, [place]  11/07/2018 8:34 AM

## 2018-11-07 NOTE — Telephone Encounter (Signed)
Spoke to patient advised Dr.ONeil wanted me to schedule you appointment in 1 week with him and then in 2 to 4 weeks.She stated someone has already called and scheduled appointments 11/14/18 at 8:40 am and 12/15/18 at 10:20 am.Advised to keep appointments as planned.

## 2018-11-07 NOTE — Progress Notes (Signed)
Pt states she was uncomfortable wearing V60 BIPAP. Dream station set up for pt instead with auto settings. Pt states dream station is much more comfortable. RT will continue to monitor

## 2018-11-07 NOTE — Progress Notes (Signed)
Patient given discharge instructions she verbalized understanding of instructions given. PIV removed with cath intact dry dressing applied to prev sites. Tele removed and CCMD informed. Patient instructed to call for her ride. She is waiting on equipment(cpap machine for home and her [facility] before she goes home).

## 2018-11-07 NOTE — Plan of Care (Signed)
  Problem: Education: Goal: Ability to demonstrate management of disease process will improve 11/07/2018 0943 by Arlina Robes, RN Outcome: Adequate for Discharge 11/07/2018 [phone] by Arlina Robes, RN Outcome: Adequate for Discharge Goal: Ability to verbalize understanding of medication therapies will improve 11/07/2018 0943 by Arlina Robes, RN Outcome: Adequate for Discharge 11/07/2018 0943 by Arlina Robes, RN Outcome: Adequate for Discharge Goal: Individualized Educational Video(s) 11/07/2018 0943 by Arlina Robes, RN Outcome: Adequate for Discharge 11/07/2018 0943 by Arlina Robes, RN Outcome: Adequate for Discharge

## 2018-11-07 NOTE — Plan of Care (Signed)
°  Problem: Education: °Goal: Ability to demonstrate management of disease process will improve °Outcome: Adequate for Discharge °Goal: Ability to verbalize understanding of medication therapies will improve °Outcome: Adequate for Discharge °Goal: Individualized Educational Video(s) °Outcome: Adequate for Discharge °  °

## 2018-11-07 NOTE — Discharge Instructions (Signed)

## 2018-11-07 NOTE — Progress Notes (Signed)
Inpatient Diabetes Program Recommendations  AACE/ADA: New Consensus Statement on Inpatient Glycemic Control (2015)  Target Ranges:  Prepandial:   less than 140 mg/dL      Peak postprandial:   less than 180 mg/dL (1-2 hours)      Critically ill patients:  140 - 180 mg/dL   Lab Results  Component Value Date   GLUCAP 112 (H) 11/07/2018   HGBA1C 8.5 (H) 11/01/2018    Review of Glycemic Control  Diabetes history: DM2 Outpatient Diabetes medications: None, previously on metformin 1 year ago Current orders for Inpatient glycemic control: Novolog 0-9 units tidwc  HgbA1C - 8.5% - uncontrolled  Inpatient Diabetes Program Recommendations:     Pt to be discharged on glipizide 5 mg bid. To f/u with [place] next week. Will likely be started on basal insulin. Pt has a dtr who's a Type 1 and knows how to inject insulin.   Spoke with pt about HgbA1c of 8.5% and goal of 7%. Discussed lifestyle modifications with diet and exercise to help body use her own insulin more efficiently. Discussed portion control and choosing high fiber foods. Has meter and supplies at home. Pt seems motivated to make changes to control her blood sugars. Answered questions. Very appreciative of visit.   Thank you. Lorenda Peck, RD, LDN, CDE Inpatient Diabetes Coordinator [phone]

## 2018-11-11 NOTE — TOC Progression Note (Addendum)
Transition of Care [facility]) - Progression Note    Patient Details  Name: Julie Benjamin MRN: [id number] Date of Birth: 1970-10-31  [facility] CM/SW Contact  Zenon Mayo, RN Phone Number: 11/11/2018, 9:33 AM  Clinical Narrative:    NCM was just notified by [facility] that [facility] state they can not take this patient after patient has been dc'd for some days.  NCM will contact another agency to see if can do LOG for [facility] and HHPT.  NCM contacted patient to inform her of the information, she states she does not have a preference of what agency that can come out.  NCM made referral to [facility] to see if they can do LOG for [facility] once a week for two weeks and HHPT two times a week for two weeks.  NCM awaiting to hear back from [place].  NCM received call from [facility] stating they will be able to do the LOG for [facility] services, NCM notified patient that she should be hearing from [facility].    Expected Discharge Plan: [place] Barriers to Discharge: No Barriers Identified  Expected Discharge Plan and Services Expected Discharge Plan: [place] In-house Referral: Financial Counselor Discharge Planning Services: CM Consult Post Acute Care Choice: Durable Medical Equipment, Home Health Living arrangements for the past 2 months: Single Family Home Expected Discharge Date: 11/07/18               DME Arranged: Carolan Clines DME Agency: [place] Date DME Agency Contacted: 11/06/18 Time DME Agency Contacted: 46 Representative spoke with at DME Agency: Suanne Marker HH Arranged: RN, PT, Disease Management [place] Agency: [facility] [place] Date [place]: 11/06/18 Time [place]: [phone] Representative spoke with at [place]: Cassie   Social Determinants of Health ([place]) Interventions    Readmission Risk Interventions No flowsheet data found.

## 2018-11-13 NOTE — Progress Notes (Signed)
Cardiology Office Note:   Date:  11/14/2018  NAME:  Jasmine AweCarolyn Zurn    MRN: [id number] DOB:  1970/12/12   PCP:  Patient, No Pcp Per  Cardiologist:  Reatha HarpsWesley T O'Neal, MD   Referring MD: No ref. provider found   Chief Complaint  Patient presents with  . Congestive Heart Failure   History of Present Illness:   Jasmine AweCarolyn Dripps is a 48 y.o. female with a hx of diabetes, HTN, OSA, systolic HF (EF 25-30%) who is being seen today for the evaluation of congestive heart failure. She was admitted to [facility] 10[facility]30-11/6 for hypertensive crisis and new-onset systolic HF. LHC normal. Etiology likely related to uncontrolled HTN.  She reports she is doing well since leaving the hospital.  Blood pressure is elevated but did not take her medications today.  She reports she is keeping a close eye on her fluid and has only taken an increased dose of Lasix 1 time.  She is euvolemic on examination today.  She does report shortness of breath with walking short distances but is getting better.  She just left the hospital last week.  She is having physical therapy to come out today to her house and she will work with them.  Regarding her diabetes she has plans to see her primary care physician to get this under better control.  A1c was 8.5 in the hospital.  She does have sleep apnea and is using her CPAP a bit more routinely.  She denies any chest pain with exertion.  She does report that her heart rate can race with activity.  Or after using her albuterol inhaler.  Likely this is related deconditioning remain in the hospital.  Her femoral cath site looks good today and is not giving her any troubles.  Past Medical History: Past Medical History:  Diagnosis Date  . Asthma   . Diabetes mellitus without complication (HCC)   . Hypertension   . Respiratory failure with hypoxia (HCC) 11/01/2018    Past Surgical History: Past Surgical History:  Procedure Laterality Date  . CESAREAN SECTION    . RIGHT/LEFT HEART CATH AND  CORONARY ANGIOGRAPHY N/A 11/05/2018   Procedure: RIGHT/LEFT HEART CATH AND CORONARY ANGIOGRAPHY;  Surgeon: Kathleene HazelMcAlhany, Christopher D, MD;  Location: [facility] INVASIVE CV LAB;  Service: Cardiovascular;  Laterality: N/A;  . TUBAL LIGATION  2001    Current Medications: Current Meds  Medication Sig  . albuterol (PROVENTIL) (2.5 MG/3ML) 0.083% nebulizer solution Take 3 mLs (2.5 mg total) by nebulization every 6 (six) hours as needed for wheezing or shortness of breath.  Marland Kitchen. albuterol (VENTOLIN HFA) 108 (90 Base) MCG/ACT inhaler Inhale 1-2 puffs into the lungs every 6 (six) hours as needed for wheezing or shortness of breath.  Marland Kitchen. amLODipine (NORVASC) 10 MG tablet Take 10 mg by mouth daily.  . budesonide-formoterol (SYMBICORT) 160-4.5 MCG/ACT inhaler Inhale 2 puffs into the lungs 2 (two) times daily.  . carvedilol (COREG) 25 MG tablet Take 1 tablet (25 mg total) by mouth 2 (two) times daily with a meal.  . furosemide (LASIX) 40 MG tablet Take 1 tablet (40 mg total) by mouth daily.  Marland Kitchen. glipiZIDE (GLUCOTROL) 5 MG tablet Take 1 tablet (5 mg total) by mouth 2 (two) times daily.  . hydrALAZINE (APRESOLINE) 50 MG tablet Take 1 tablet (50 mg total) by mouth every 8 (eight) hours.  Marland Kitchen. ipratropium-albuterol (DUONEB) 0.5-2.5 (3) MG/3ML SOLN Take 3 mLs by nebulization every 6 (six) hours as needed (for breathing).  Marland Kitchen. losartan (COZAAR) 100  MG tablet Take 1 tablet (100 mg total) by mouth daily.  . montelukast (SINGULAIR) 10 MG tablet Take 1 tablet (10 mg total) by mouth at bedtime.  Marland Kitchen spironolactone (ALDACTONE) 25 MG tablet Take 1 tablet (25 mg total) by mouth daily.     Allergies:    Patient has no known allergies.   Social History: Social History   Socioeconomic History  . Marital status: Single    Spouse name: Not on file  . Number of children: Not on file  . Years of education: Not on file  . Highest education level: Not on file  Occupational History  . Not on file  Social Needs  . Financial resource strain:  Not on file  . Food insecurity    Worry: Not on file    Inability: Not on file  . Transportation needs    Medical: Not on file    Non-medical: Not on file  Tobacco Use  . Smoking status: Never Smoker  . Smokeless tobacco: Never Used  Substance and Sexual Activity  . Alcohol use: Not on file  . Drug use: Not on file  . Sexual activity: Not on file  Lifestyle  . Physical activity    Days per week: Not on file    Minutes per session: Not on file  . Stress: Not on file  Relationships  . Social Herbalist on phone: Not on file    Gets together: Not on file    Attends religious service: Not on file    Active member of club or organization: Not on file    Attends meetings of clubs or organizations: Not on file    Relationship status: Not on file  Other Topics Concern  . Not on file  Social History Narrative  . Not on file     Family History: The patient's family history includes COPD in her mother; Hypertension in her mother; Kidney failure in her sister.  ROS:   All other ROS reviewed and negative. Pertinent positives noted in the HPI.     EKGs/Labs/Other Studies Reviewed:   The following studies were personally reviewed by me today:  EKG:  EKG is ordered today.  The ekg ordered today demonstrates normal sinus rhythm, heart rate 97, left atrial normality, left axis deviation, no acute ST-T changes, no evidence of prior infarction, and was personally reviewed by me.   TSH 0.72 LDL 70 A1c 8.5  TTE 11/02/2018  1. Left ventricular ejection fraction, by visual estimation, is 25 to 30%. The left ventricle has severely decreased function. There is mildly increased left ventricular hypertrophy.  2. Definity contrast agent was given IV to delineate the left ventricular endocardial borders.  3. Elevated left atrial pressure.  4. Left ventricular diastolic parameters are consistent with Grade II diastolic dysfunction (pseudonormalization).  5. Severely dilated left  ventricular internal cavity size.  6. Global right ventricle has normal systolic function.The right ventricular size is moderately enlarged. No increase in right ventricular wall thickness.  7. Left atrial size was severely dilated.  8. Right atrial size was moderately dilated.  9. The mitral valve is normal in structure. Trace mitral valve regurgitation. 10. The tricuspid valve is not well visualized. Tricuspid valve regurgitation is not demonstrated. 11. The aortic valve is tricuspid. Aortic valve regurgitation is not visualized.  LHC/RHC 11/05/2018  Hemodynamic findings consistent with mild pulmonary hypertension.   1. No angiographic evidence of CAD 2. Elevated filling pressures. (RV 46/6/13, PA 43/20/35, PCWP21, LVEDP  20)  Recommendations: She remains mildly volume overloaded. Will give one dose of IV Lasix tonight. No further ischemic workup.   Recent Labs: 10/31/2018: ALT 23; B Natriuretic Peptide 931.6 11/05/2018: Magnesium 1.8; TSH 0.723 11/07/2018: BUN 31; Creatinine, Ser 1.37; Hemoglobin 12.4; Platelets 361; Potassium 4.0; Sodium 142   Recent Lipid Panel    Component Value Date/Time   CHOL 132 11/05/2018 0312   TRIG 97 11/05/2018 0312   HDL 43 11/05/2018 0312   CHOLHDL 3.1 11/05/2018 0312   VLDL 19 11/05/2018 0312   LDLCALC 70 11/05/2018 0312    Physical Exam:   VS:  BP (!) 160/108   Pulse 97   Ht 5\' 3"  (1.6 m)   Wt 295 lb 6 oz (134 kg)   BMI 52.32 kg/m    Wt Readings from Last 3 Encounters:  11/14/18 295 lb 6 oz (134 kg)  11/07/18 285 lb 6.4 oz (129.5 kg)    General: Well nourished, well developed, in no acute distress Heart: Atraumatic, normal size  Eyes: PEERLA, EOMI  Neck: Supple, no JVD Endocrine: No thryomegaly Cardiac: Normal S1, S2; RRR; no murmurs, rubs, or gallops Lungs: Clear to auscultation bilaterally, no wheezing, rhonchi or rales  Abd: Soft, nontender, no hepatomegaly  Ext: No edema, pulses 2+ Musculoskeletal: No deformities, BUE and BLE  strength normal and equal Skin: Warm and dry, no rashes   Neuro: Alert and oriented to person, place, time, and situation, CNII-XII grossly intact, no focal deficits  Psych: Normal mood and affect   ASSESSMENT:   Chatara Lucente is a 48 y.o. female who presents for the following: 1. Chronic systolic heart failure (HCC)   2. Essential hypertension   3. Obesity, morbid, BMI 50 or higher (HCC)     PLAN:   1. Chronic systolic heart failure (HCC), NYHA Class II/III, ACC/AHA Stage C, EF 25-30%, non-ischemic -Admitted for decompensated heart failure last month.  Ejection fraction 25 to 30%.  Normal left heart catheterization and normal hemodynamics. -She is euvolemic on examination. -Blood pressure but did not take medications today.  We will continue carvedilol 25 mg twice daily, losartan 100 mg daily, Aldactone 25 mg daily, hydralazine 50 mg 3 times daily -She will take Lasix 40 mg daily and repeat an afternoon dose if she has worsening edema or weight gain -She is working on diet and exercise and will reduce her salt intake.  Seems to be doing well with this. -Would prefer Entresto but insurance will be an issue.  Also prefer to get her on SGLT2 inhibitor however this is also limited by insurance. -I will see her back in 1 month and she will take her medications before she sees me to see if we need to titrate any medications. -The goal will be to repeat an echocardiogram in 3 to 6 months to determine her need for an ICD.  I am hopeful she will recover her ejection fraction.  2. Essential hypertension -No medication changes today.  BP meds as above.  3. Obesity, morbid, BMI 50 or higher (HCC) -She will continue to diet exercise and lose weight.  Disposition: Return in about 1 month (around 12/14/2018).  Medication Adjustments/Labs and Tests Ordered: Current medicines are reviewed at length with the patient today.  Concerns regarding medicines are outlined above.  No orders of the defined  types were placed in this encounter.  No orders of the defined types were placed in this encounter.   Patient Instructions  Medication Instructions:  Continue same medications *If  you need a refill on your cardiac medications before your next appointment, please call your pharmacy*  Lab Work: None ordered   Testing/Procedures: None ordered  Follow-Up: At [facility], you and your health needs are our priority.  As part of our continuing mission to provide you with exceptional heart care, we have created designated Provider Care Teams.  These Care Teams include your primary Cardiologist (physician) and Advanced Practice Providers (APPs -  Physician Assistants and Nurse Practitioners) who all work together to provide you with the care you need, when you need it.  Your next appointment:  Monday 12/15/18   The format for your next appointment:  Office   Provider:  Dr.O'Neil        Signed, Gerri Spore T. Flora Lipps, MD [facility]  [address] [place], [place] [address] [phone]  11/14/2018 9:43 AM

## 2018-11-14 ENCOUNTER — Other Ambulatory Visit: Payer: Self-pay

## 2018-11-14 ENCOUNTER — Encounter: Payer: Self-pay | Admitting: Cardiovascular Disease

## 2018-11-14 ENCOUNTER — Ambulatory Visit (INDEPENDENT_AMBULATORY_CARE_PROVIDER_SITE_OTHER): Payer: Self-pay | Admitting: Cardiovascular Disease

## 2018-11-14 ENCOUNTER — Other Ambulatory Visit: Payer: Self-pay | Admitting: Emergency Medicine

## 2018-11-14 VITALS — BP 160/108 | HR 97 | Ht 63.0 in | Wt 295.4 lb

## 2018-11-14 DIAGNOSIS — I10 Essential (primary) hypertension: Secondary | ICD-10-CM

## 2018-11-14 DIAGNOSIS — I5022 Chronic systolic (congestive) heart failure: Secondary | ICD-10-CM

## 2018-11-14 NOTE — Telephone Encounter (Signed)
New Message  *STAT* If patient is at the pharmacy, call can be transferred to refill team.   1. Which medications need to be refilled? (please list name of each medication and dose if known)  amLODipine (NORVASC) 10 MG tablet  2. Which pharmacy/location (including street and city if local pharmacy) is medication to be sent to? [place], [place] - [address] AT [place]  3. Do they need a 30 day or 90 day supply? 30 day

## 2018-11-14 NOTE — Patient Instructions (Signed)
Medication Instructions:  Continue same medications *If you need a refill on your cardiac medications before your next appointment, please call your pharmacy*  Lab Work: None ordered   Testing/Procedures: None ordered  Follow-Up: At [facility], you and your health needs are our priority.  As part of our continuing mission to provide you with exceptional heart care, we have created designated Provider Care Teams.  These Care Teams include your primary Cardiologist (physician) and Advanced Practice Providers (APPs -  Physician Assistants and Nurse Practitioners) who all work together to provide you with the care you need, when you need it.  Your next appointment:  Monday 12/15/18   The format for your next appointment:  Office   Provider:  Dr.O'Neil

## 2018-11-18 ENCOUNTER — Institutional Professional Consult (permissible substitution): Payer: Self-pay | Admitting: Pulmonary Disease

## 2018-11-18 ENCOUNTER — Other Ambulatory Visit: Payer: Self-pay

## 2018-11-18 ENCOUNTER — Other Ambulatory Visit (INDEPENDENT_AMBULATORY_CARE_PROVIDER_SITE_OTHER): Payer: Self-pay

## 2018-11-18 ENCOUNTER — Ambulatory Visit: Payer: Self-pay | Attending: Critical Care Medicine | Admitting: Critical Care Medicine

## 2018-11-18 ENCOUNTER — Inpatient Hospital Stay: Payer: Self-pay | Admitting: Critical Care Medicine

## 2018-11-18 ENCOUNTER — Encounter: Payer: Self-pay | Admitting: Critical Care Medicine

## 2018-11-18 VITALS — BP 140/95 | HR 79 | Temp 99.1°F | Resp 18 | Ht 63.0 in | Wt 295.0 lb

## 2018-11-18 DIAGNOSIS — I10 Essential (primary) hypertension: Secondary | ICD-10-CM

## 2018-11-18 DIAGNOSIS — E11630 Type 2 diabetes mellitus with periodontal disease: Secondary | ICD-10-CM

## 2018-11-18 DIAGNOSIS — I428 Other cardiomyopathies: Secondary | ICD-10-CM

## 2018-11-18 DIAGNOSIS — I509 Heart failure, unspecified: Secondary | ICD-10-CM

## 2018-11-18 DIAGNOSIS — J9601 Acute respiratory failure with hypoxia: Secondary | ICD-10-CM

## 2018-11-18 DIAGNOSIS — I5022 Chronic systolic (congestive) heart failure: Secondary | ICD-10-CM

## 2018-11-18 DIAGNOSIS — E1165 Type 2 diabetes mellitus with hyperglycemia: Secondary | ICD-10-CM

## 2018-11-18 DIAGNOSIS — E119 Type 2 diabetes mellitus without complications: Secondary | ICD-10-CM

## 2018-11-18 DIAGNOSIS — N289 Disorder of kidney and ureter, unspecified: Secondary | ICD-10-CM

## 2018-11-18 DIAGNOSIS — J189 Pneumonia, unspecified organism: Secondary | ICD-10-CM

## 2018-11-18 LAB — GLUCOSE, POCT (MANUAL RESULT ENTRY): POC Glucose: 185 mg/dl — AB (ref 70–99)

## 2018-11-18 MED ORDER — CARVEDILOL 25 MG PO TABS: 25.0000 mg | ORAL_TABLET | Freq: Two times a day (BID) | ORAL | 3 refills | Status: DC

## 2018-11-18 MED ORDER — BUDESONIDE-FORMOTEROL FUMARATE 160-4.5 MCG/ACT IN AERO: 2.0000 | INHALATION_SPRAY | Freq: Two times a day (BID) | RESPIRATORY_TRACT | 12 refills | Status: DC

## 2018-11-18 MED ORDER — ENTRESTO 49-51 MG PO TABS: 1.0000 | ORAL_TABLET | Freq: Two times a day (BID) | ORAL | 3 refills | Status: DC

## 2018-11-18 MED ORDER — SPIRONOLACTONE 25 MG PO TABS: 25.0000 mg | ORAL_TABLET | Freq: Every day | ORAL | 3 refills | Status: DC

## 2018-11-18 MED ORDER — SITAGLIPTIN PHOSPHATE 100 MG PO TABS: 100.0000 mg | ORAL_TABLET | Freq: Every day | ORAL | 4 refills | Status: DC

## 2018-11-18 MED ORDER — TRUEPLUS LANCETS 28G MISC: 1 refills | Status: AC

## 2018-11-18 MED ORDER — TRUE METRIX METER W/DEVICE KIT: PACK | 0 refills | Status: AC

## 2018-11-18 MED ORDER — TRUE METRIX BLOOD GLUCOSE TEST VI STRP: ORAL_STRIP | 12 refills | Status: DC

## 2018-11-18 MED ORDER — FUROSEMIDE 40 MG PO TABS: 40.0000 mg | ORAL_TABLET | Freq: Every day | ORAL | 3 refills | Status: DC

## 2018-11-18 MED ORDER — MONTELUKAST SODIUM 10 MG PO TABS: 10.0000 mg | ORAL_TABLET | Freq: Every day | ORAL | 12 refills | Status: DC

## 2018-11-18 MED ORDER — AMLODIPINE BESYLATE 10 MG PO TABS: 10.0000 mg | ORAL_TABLET | Freq: Every day | ORAL | 3 refills | Status: DC

## 2018-11-18 MED FILL — ENTRESTO 49 MG-51 MG TABLET: 49-51 | 30 days supply | Qty: 60 | Fill #0

## 2018-11-18 MED FILL — !TRUE METRIX BLOOD GLUCOSE: 1 days supply | Qty: 1 | Fill #0

## 2018-11-18 MED FILL — TRUE METRIX TEST STRIP: 50 days supply | Qty: 100 | Fill #0

## 2018-11-18 MED FILL — JANUVIA 100 MG TABLET: 100 | 30 days supply | Qty: 30 | Fill #0

## 2018-11-18 MED FILL — TRUEplus LANCETS 28G MISC: 50 days supply | Qty: 100 | Fill #0

## 2018-11-18 NOTE — Assessment & Plan Note (Signed)
Chronic systolic heart failure with no evidence of coronary artery disease on recent coronary artery study

## 2018-11-18 NOTE — Assessment & Plan Note (Signed)
Type 2 diabetes without complication  We will begin Januvia 100 mg daily and discontinue glipizide and also issue for the patient refills on her testing supplies and meter

## 2018-11-18 NOTE — Assessment & Plan Note (Signed)
Hypertension poorly controlled now improved with current heart failure program  Note we will plan to discontinue amlodipine and continue Entresto twice daily and Coreg twice daily

## 2018-11-18 NOTE — Assessment & Plan Note (Signed)
Acute respiratory failure with hypoxemia improved  We will continue BiPAP for now and follow-up with pulmonary medicine

## 2018-11-18 NOTE — Assessment & Plan Note (Signed)
Periodontal disease secondary type 2 diabetes  Referral to dental has been made

## 2018-11-18 NOTE — Telephone Encounter (Signed)
Yes, please refill. -W

## 2018-11-18 NOTE — Patient Instructions (Addendum)
We are drawing labs today to follow-up your kidney and liver function and blood counts  Discontinue hydralazine and losartan when you obtain Entresto The Entresto dose will be 1 pill twice daily you will get this from our pharmacy with patient assistance  Note please discontinue the amlopdipine  Discontinue glipizide when you get the Januvia The Januvia dose is once daily for your blood sugar  Follow a low-salt diabetic diet as noted below  Return to see Dr. Joya Gaskins in 1 month  Glucose testing supplies obtained and also we will get you financial assistance  Stay on furosemide Coreg and Aldactone as prescribed  Keep your appointments with the pulmonary clinic for your BiPAP follow-up  Keep your appointments with cardiology and follow-up as well   Heart Failure Action Plan A heart failure action plan helps you understand what to do when you have symptoms of heart failure. Follow the plan that was created by you and your health care provider. Review your plan each time you visit your health care provider. Red zone These signs and symptoms mean you should get medical help right away:  You have trouble breathing when resting.  You have a dry cough that is getting worse.  You have swelling or pain in your legs or abdomen that is getting worse.  You suddenly gain more than 2-3 lb (0.9-1.4 kg) in a day, or more than 5 lb (2.3 kg) in one week. This amount may be more or less depending on your condition.  You have trouble staying awake or you feel confused.  You have chest pain.  You do not have an appetite.  You pass out. If you experience any of these symptoms:  Call your local emergency services (911 in the [place] right away or seek help at the emergency department of the nearest hospital. Yellow zone These signs and symptoms mean your condition may be getting worse and you should make some changes:  You have trouble breathing when you are active or you need to sleep with  extra pillows.  You have swelling in your legs or abdomen.  You gain 2-3 lb (0.9-1.4 kg) in one day, or 5 lb (2.3 kg) in one week. This amount may be more or less depending on your condition.  You get tired easily.  You have trouble sleeping.  You have a dry cough. If you experience any of these symptoms:  Contact your health care provider within the next day.  Your health care provider may adjust your medicines. Green zone These signs mean you are doing well and can continue what you are doing:  You do not have shortness of breath.  You have very little swelling or no new swelling.  Your weight is stable (no gain or loss).  You have a normal activity level.  You do not have chest pain or any other new symptoms. Follow these instructions at home:  Take over-the-counter and prescription medicines only as told by your health care provider.  Weigh yourself daily. Your target weight is __________ lb (__________ kg). ? Call your health care provider if you gain more than __________ lb (__________ kg) in a day, or more than __________ lb (__________ kg) in one week.  Eat a heart-healthy diet. Work with a diet and nutrition specialist (dietitian) to create an eating plan that is best for you.  Keep all follow-up visits as told by your health care provider. This is important. Where to find more information  American Heart Association: www.heart.org Summary  Follow the action plan that was created by you and your health care provider.  Get help right away if you have any symptoms in the Red zone. This information is not intended to replace advice given to you by your health care provider. Make sure you discuss any questions you have with your health care provider. Document Released: 01/28/2016 Document Revised: 11/30/2016 Document Reviewed: 01/28/2016 Elsevier Patient Education  2020 [employer].  Hypertension, Adult High blood pressure (hypertension) is when the force of  blood pumping through the arteries is too strong. The arteries are the blood vessels that carry blood from the heart throughout the body. Hypertension forces the heart to work harder to pump blood and may cause arteries to become narrow or stiff. Untreated or uncontrolled hypertension can cause a heart attack, heart failure, a stroke, kidney disease, and other problems. A blood pressure reading consists of a higher number over a lower number. Ideally, your blood pressure should be below 120/80. The first ("top") number is called the systolic pressure. It is a measure of the pressure in your arteries as your heart beats. The second ("bottom") number is called the diastolic pressure. It is a measure of the pressure in your arteries as the heart relaxes. What are the causes? The exact cause of this condition is not known. There are some conditions that result in or are related to high blood pressure. What increases the risk? Some risk factors for high blood pressure are under your control. The following factors may make you more likely to develop this condition:  Smoking.  Having type 2 diabetes mellitus, high cholesterol, or both.  Not getting enough exercise or physical activity.  Being overweight.  Having too much fat, sugar, calories, or salt (sodium) in your diet.  Drinking too much alcohol. Some risk factors for high blood pressure may be difficult or impossible to change. Some of these factors include:  Having chronic kidney disease.  Having a family history of high blood pressure.  Age. Risk increases with age.  Race. You may be at higher risk if you are African American.  Gender. Men are at higher risk than women before age 34. After age 51, women are at higher risk than men.  Having obstructive sleep apnea.  Stress. What are the signs or symptoms? High blood pressure may not cause symptoms. Very high blood pressure (hypertensive crisis) may  cause:  Headache.  Anxiety.  Shortness of breath.  Nosebleed.  Nausea and vomiting.  Vision changes.  Severe chest pain.  Seizures. How is this diagnosed? This condition is diagnosed by measuring your blood pressure while you are seated, with your arm resting on a flat surface, your legs uncrossed, and your feet flat on the floor. The cuff of the blood pressure monitor will be placed directly against the skin of your upper arm at the level of your heart. It should be measured at least twice using the same arm. Certain conditions can cause a difference in blood pressure between your right and left arms. Certain factors can cause blood pressure readings to be lower or higher than normal for a short period of time:  When your blood pressure is higher when you are in a health care provider's office than when you are at home, this is called white coat hypertension. Most people with this condition do not need medicines.  When your blood pressure is higher at home than when you are in a health care provider's office, this is called masked hypertension.  Most people with this condition may need medicines to control blood pressure. If you have a high blood pressure reading during one visit or you have normal blood pressure with other risk factors, you may be asked to:  Return on a different day to have your blood pressure checked again.  Monitor your blood pressure at home for 1 week or longer. If you are diagnosed with hypertension, you may have other blood or imaging tests to help your health care provider understand your overall risk for other conditions. How is this treated? This condition is treated by making healthy lifestyle changes, such as eating healthy foods, exercising more, and reducing your alcohol intake. Your health care provider may prescribe medicine if lifestyle changes are not enough to get your blood pressure under control, and if:  Your systolic blood pressure is above  130.  Your diastolic blood pressure is above 80. Your personal target blood pressure may vary depending on your medical conditions, your age, and other factors. Follow these instructions at home: Eating and drinking   Eat a diet that is high in fiber and potassium, and low in sodium, added sugar, and fat. An example eating plan is called the DASH (Dietary Approaches to Stop Hypertension) diet. To eat this way: ? Eat plenty of fresh fruits and vegetables. Try to fill one half of your plate at each meal with fruits and vegetables. ? Eat whole grains, such as whole-wheat pasta, brown rice, or whole-grain bread. Fill about one fourth of your plate with whole grains. ? Eat or drink low-fat dairy products, such as skim milk or low-fat yogurt. ? Avoid fatty cuts of meat, processed or cured meats, and poultry with skin. Fill about one fourth of your plate with lean proteins, such as fish, chicken without skin, beans, eggs, or tofu. ? Avoid pre-made and processed foods. These tend to be higher in sodium, added sugar, and fat.  Reduce your daily sodium intake. Most people with hypertension should eat less than 1,500 mg of sodium a day.  Do not drink alcohol if: ? Your health care provider tells you not to drink. ? You are pregnant, may be pregnant, or are planning to become pregnant.  If you drink alcohol: ? Limit how much you use to:  0-1 drink a day for women.  0-2 drinks a day for men. ? Be aware of how much alcohol is in your drink. In the [place] one drink equals one 12 oz bottle of beer (355 mL), one 5 oz glass of wine (148 mL), or one 1 oz glass of hard liquor (44 mL). Lifestyle   Work with your health care provider to maintain a healthy body weight or to lose weight. Ask what an ideal weight is for you.  Get at least 30 minutes of exercise most days of the week. Activities may include walking, swimming, or biking.  Include exercise to strengthen your muscles (resistance exercise),  such as Pilates or lifting weights, as part of your weekly exercise routine. Try to do these types of exercises for 30 minutes at least 3 days a week.  Do not use any products that contain nicotine or tobacco, such as cigarettes, e-cigarettes, and chewing tobacco. If you need help quitting, ask your health care provider.  Monitor your blood pressure at home as told by your health care provider.  Keep all follow-up visits as told by your health care provider. This is important. Medicines  Take over-the-counter and prescription medicines only as told by  your health care provider. Follow directions carefully. Blood pressure medicines must be taken as prescribed.  Do not skip doses of blood pressure medicine. Doing this puts you at risk for problems and can make the medicine less effective.  Ask your health care provider about side effects or reactions to medicines that you should watch for. Contact a health care provider if you:  Think you are having a reaction to a medicine you are taking.  Have headaches that keep coming back (recurring).  Feel dizzy.  Have swelling in your ankles.  Have trouble with your vision. Get help right away if you:  Develop a severe headache or confusion.  Have unusual weakness or numbness.  Feel faint.  Have severe pain in your chest or abdomen.  Vomit repeatedly.  Have trouble breathing. Summary  Hypertension is when the force of blood pumping through your arteries is too strong. If this condition is not controlled, it may put you at risk for serious complications.  Your personal target blood pressure may vary depending on your medical conditions, your age, and other factors. For most people, a normal blood pressure is less than 120/80.  Hypertension is treated with lifestyle changes, medicines, or a combination of both. Lifestyle changes include losing weight, eating a healthy, low-sodium diet, exercising more, and limiting alcohol. This  information is not intended to replace advice given to you by your health care provider. Make sure you discuss any questions you have with your health care provider. Document Released: 12/18/2004 Document Revised: 08/28/2017 Document Reviewed: 08/28/2017 Elsevier Patient Education  2020 Else[employer] Inc.  Diabetes Mellitus and Foot Care Foot care is an important part of your health, especially when you have diabetes. Diabetes may cause you to have problems because of poor blood flow (circulation) to your feet and legs, which can cause your skin to:  Become thinner and drier.  Break more easily.  Heal more slowly.  Peel and crack. You may also have nerve damage (neuropathy) in your legs and feet, causing decreased feeling in them. This means that you may not notice minor injuries to your feet that could lead to more serious problems. Noticing and addressing any potential problems early is the best way to prevent future foot problems. How to care for your feet Foot hygiene  Wash your feet daily with warm water and mild soap. Do not use hot water. Then, pat your feet and the areas between your toes until they are completely dry. Do not soak your feet as this can dry your skin.  Trim your toenails straight across. Do not dig under them or around the cuticle. File the edges of your nails with an emery board or nail file.  Apply a moisturizing lotion or petroleum jelly to the skin on your feet and to dry, brittle toenails. Use lotion that does not contain alcohol and is unscented. Do not apply lotion between your toes. Shoes and socks  Wear clean socks or stockings every day. Make sure they are not too tight. Do not wear knee-high stockings since they may decrease blood flow to your legs.  Wear shoes that fit properly and have enough cushioning. Always look in your shoes before you put them on to be sure there are no objects inside.  To break in new shoes, wear them for just a few hours a day.  This prevents injuries on your feet. Wounds, scrapes, corns, and calluses  Check your feet daily for blisters, cuts, bruises, sores, and redness. If you  cannot see the bottom of your feet, use a mirror or ask someone for help.  Do not cut corns or calluses or try to remove them with medicine.  If you find a minor scrape, cut, or break in the skin on your feet, keep it and the skin around it clean and dry. You may clean these areas with mild soap and water. Do not clean the area with peroxide, alcohol, or iodine.  If you have a wound, scrape, corn, or callus on your foot, look at it several times a day to make sure it is healing and not infected. Check for: ? Redness, swelling, or pain. ? Fluid or blood. ? Warmth. ? Pus or a bad smell. General instructions  Do not cross your legs. This may decrease blood flow to your feet.  Do not use heating pads or hot water bottles on your feet. They may burn your skin. If you have lost feeling in your feet or legs, you may not know this is happening until it is too late.  Protect your feet from hot and cold by wearing shoes, such as at the beach or on hot pavement.  Schedule a complete foot exam at least once a year (annually) or more often if you have foot problems. If you have foot problems, report any cuts, sores, or bruises to your health care provider immediately. Contact a health care provider if:  You have a medical condition that increases your risk of infection and you have any cuts, sores, or bruises on your feet.  You have an injury that is not healing.  You have redness on your legs or feet.  You feel burning or tingling in your legs or feet.  You have pain or cramps in your legs and feet.  Your legs or feet are numb.  Your feet always feel cold.  You have pain around a toenail. Get help right away if:  You have a wound, scrape, corn, or callus on your foot and: ? You have pain, swelling, or redness that gets worse. ? You  have fluid or blood coming from the wound, scrape, corn, or callus. ? Your wound, scrape, corn, or callus feels warm to the touch. ? You have pus or a bad smell coming from the wound, scrape, corn, or callus. ? You have a fever. ? You have a red line going up your leg. Summary  Check your feet every day for cuts, sores, red spots, swelling, and blisters.  Moisturize feet and legs daily.  Wear shoes that fit properly and have enough cushioning.  If you have foot problems, report any cuts, sores, or bruises to your health care provider immediately.  Schedule a complete foot exam at least once a year (annually) or more often if you have foot problems. This information is not intended to replace advice given to you by your health care provider. Make sure you discuss any questions you have with your health care provider. Document Released: 12/16/1999 Document Revised: 01/30/2017 Document Reviewed: 01/20/2016 Elsevier Patient Education  2020 [employer].  Diabetes Mellitus and Nutrition, Adult When you have diabetes (diabetes mellitus), it is very important to have healthy eating habits because your blood sugar (glucose) levels are greatly affected by what you eat and drink. Eating healthy foods in the appropriate amounts, at about the same times every day, can help you:  Control your blood glucose.  Lower your risk of heart disease.  Improve your blood pressure.  Reach or maintain a  healthy weight. Every person with diabetes is different, and each person has different needs for a meal plan. Your health care provider may recommend that you work with a diet and nutrition specialist (dietitian) to make a meal plan that is best for you. Your meal plan may vary depending on factors such as:  The calories you need.  The medicines you take.  Your weight.  Your blood glucose, blood pressure, and cholesterol levels.  Your activity level.  Other health conditions you have, such as heart  or kidney disease. How do carbohydrates affect me? Carbohydrates, also called carbs, affect your blood glucose level more than any other type of food. Eating carbs naturally raises the amount of glucose in your blood. Carb counting is a method for keeping track of how many carbs you eat. Counting carbs is important to keep your blood glucose at a healthy level, especially if you use insulin or take certain oral diabetes medicines. It is important to know how many carbs you can safely have in each meal. This is different for every person. Your dietitian can help you calculate how many carbs you should have at each meal and for each snack. Foods that contain carbs include:  Bread, cereal, rice, pasta, and crackers.  Potatoes and corn.  Peas, beans, and lentils.  Milk and yogurt.  Fruit and juice.  Desserts, such as cakes, cookies, ice cream, and candy. How does alcohol affect me? Alcohol can cause a sudden decrease in blood glucose (hypoglycemia), especially if you use insulin or take certain oral diabetes medicines. Hypoglycemia can be a life-threatening condition. Symptoms of hypoglycemia (sleepiness, dizziness, and confusion) are similar to symptoms of having too much alcohol. If your health care provider says that alcohol is safe for you, follow these guidelines:  Limit alcohol intake to no more than 1 drink per day for nonpregnant women and 2 drinks per day for men. One drink equals 12 oz of beer, 5 oz of wine, or 1 oz of hard liquor.  Do not drink on an empty stomach.  Keep yourself hydrated with water, diet soda, or unsweetened iced tea.  Keep in mind that regular soda, juice, and other mixers may contain a lot of sugar and must be counted as carbs. What are tips for following this plan?  Reading food labels  Start by checking the serving size on the "Nutrition Facts" label of packaged foods and drinks. The amount of calories, carbs, fats, and other nutrients listed on the label  is based on one serving of the item. Many items contain more than one serving per package.  Check the total grams (g) of carbs in one serving. You can calculate the number of servings of carbs in one serving by dividing the total carbs by 15. For example, if a food has 30 g of total carbs, it would be equal to 2 servings of carbs.  Check the number of grams (g) of saturated and trans fats in one serving. Choose foods that have low or no amount of these fats.  Check the number of milligrams (mg) of salt (sodium) in one serving. Most people should limit total sodium intake to less than 2,300 mg per day.  Always check the nutrition information of foods labeled as "low-fat" or "nonfat". These foods may be higher in added sugar or refined carbs and should be avoided.  Talk to your dietitian to identify your daily goals for nutrients listed on the label. Shopping  Avoid buying canned, premade, or  processed foods. These foods tend to be high in fat, sodium, and added sugar.  Shop around the outside edge of the grocery store. This includes fresh fruits and vegetables, bulk grains, fresh meats, and fresh dairy. Cooking  Use low-heat cooking methods, such as baking, instead of high-heat cooking methods like deep frying.  Cook using healthy oils, such as olive, canola, or sunflower oil.  Avoid cooking with butter, cream, or high-fat meats. Meal planning  Eat meals and snacks regularly, preferably at the same times every day. Avoid going long periods of time without eating.  Eat foods high in fiber, such as fresh fruits, vegetables, beans, and whole grains. Talk to your dietitian about how many servings of carbs you can eat at each meal.  Eat 4-6 ounces (oz) of lean protein each day, such as lean meat, chicken, fish, eggs, or tofu. One oz of lean protein is equal to: ? 1 oz of meat, chicken, or fish. ? 1 egg. ?  cup of tofu.  Eat some foods each day that contain healthy fats, such as avocado,  nuts, seeds, and fish. Lifestyle  Check your blood glucose regularly.  Exercise regularly as told by your health care provider. This may include: ? 150 minutes of moderate-intensity or vigorous-intensity exercise each week. This could be brisk walking, biking, or water aerobics. ? Stretching and doing strength exercises, such as yoga or weightlifting, at least 2 times a week.  Take medicines as told by your health care provider.  Do not use any products that contain nicotine or tobacco, such as cigarettes and e-cigarettes. If you need help quitting, ask your health care provider.  Work with a Veterinary surgeoncounselor or diabetes educator to identify strategies to manage stress and any emotional and social challenges. Questions to ask a health care provider  Do I need to meet with a diabetes educator?  Do I need to meet with a dietitian?  What number can I call if I have questions?  When are the best times to check my blood glucose? Where to find more information:  American Diabetes Association: diabetes.org  Academy of Nutrition and Dietetics: www.eatright.[employer]  National Institute of Diabetes and Digestive and Kidney Diseases (NIH): [employer].tnwww.niddk.nih.gov Summary  A healthy meal plan will help you control your blood glucose and maintain a healthy lifestyle.  Working with a diet and nutrition specialist (dietitian) can help you make a meal plan that is best for you.  Keep in mind that carbohydrates (carbs) and alcohol have immediate effects on your blood glucose levels. It is important to count carbs and to use alcohol carefully. This information is not intended to replace advice given to you by your health care provider. Make sure you discuss any questions you have with your health care provider. Document Released: 09/14/2004 Document Revised: 11/30/2016 Document Reviewed: 01/23/2016 Elsevier Patient Education  2020 [employer]

## 2018-11-18 NOTE — Assessment & Plan Note (Signed)
Right upper lobe pneumonia improved on exam will need follow-up chest x-ray  Follow-up chest x-ray ordered

## 2018-11-18 NOTE — Assessment & Plan Note (Signed)
We will follow-up basic metabolic panel

## 2018-11-18 NOTE — Progress Notes (Signed)
Subjective:    Patient ID: Julie Benjamin, female    DOB: 10-14-1970, 48 y.o.   MRN: [id number]  This is a 48 year old female who is seen in post hospital follow-up and also wishing to establish for primary care Patient was admitted between 30 October and 6 November with acute respiratory failure and associated new onset nonischemic cardiomyopathy, hypertension, diabetes.  The patient also was found to have right upper lobe pneumonia as well as renal insufficiency.  The patient does have history of longstanding hypertension in the past.  The hypertension was pre-existing however the heart failure is a new diagnosis.  She just moved here from [place] in [place] to established here in the [place] area  Below is a copy of the discharge summary Admit date: 10/31/2018 Discharge date: 11/07/2018  Time spent: 35 minutes  Recommendations for Outpatient Follow-up:  1. New medications as below and have been explained to patient 2. Needs Chem-12 CBC in about 1 to 2 weeks 3. BiPAP being arranged will need outpatient titration and possible pulmonary follow-up in the outpatient setting for sleep study and I will give her the details for Dr. Cordelia Pen clinic at [facility] pulmonology for this to be arranged 4. Recommend medical weight loss versus gastric outlet procedure  Discharge Diagnoses:  Principal Problem:   Acute respiratory failure with hypoxia (HCC) Active Problems:   Hypertension   Diabetes mellitus without complication (HCC)   Asthma   Right upper lobe pneumonia   Renal insufficiency   History of DVT of lower extremity   NICM (nonischemic cardiomyopathy) ([place])   Discharge Condition: Improved  Diet recommendation: Heart healthy  Filed Weights  11/05/18 0300 11/06/18 0442 11/07/18 0530 Weight: 129.3 kg 128.4 kg 129.5 kg   History of present illness:  48 year old Fem known asthma, DM TY 2, hypertension admitted 10/31with DOE-developed on 11/1 ???wob,encephalopathic  with elevated CO2 found to have right upper lobe infiltrate and pulmonology consulted-placed on BiPAP cardiology was consulted as she had new onset systolic heart failure EF 25-30% on echo 11/02/2018 She was diuresed   Hospital Course:  Acute systolic diastolic heart failure Cardiac cath11/4 showed no Coronary obst-meds on discharge included Coreg hydralazine Aldactone amlodipine and Lasix Outpatient follow-up needed regarding titration of meds etc. Cardiomyopathy As above Body mass index is 50.15 kg/m. OSA not on CPAP Patient will need this set up on d/c-CM is awareand will coordinate We will arrange for outpatient sleep study in addition to titration none management of the same with pulmonology Mild hypokalemia Replaced 11/4--labs stable Right upper lobe infiltrate rec'd Abx until 11/1--felt non infectious--no sputum no s/sym per patient and antibiotics were discontinued HTN poorly controlled Meds as above Remote RLE DVT Will need OP follow up and monitoring CKD ii styable at this time-monitor on dirurectics and aldactone DM ty ii a1c 8.5 cotinue SSI, CBg ranges 120-140 Tells me on discharge that she has been taking Metformin in the outpatient setting but has not taken it in over a year her A1c is 8.5 Technically she qualifies for insulin with an A1c above 8 however she wants to try to lose some weight and attempt to use oral medication glipizide instead of Metformin I have counseled her regarding hypoglycemia  Consultants:  Cardiology  Critical care Procedures:  Echo  Cardiac cath 11/4  Note today her CBG was 185 blood pressure was 140/95.  Her weight actually is up 10 pounds from discharge.  She does have a BiPAP machine she is wearing this at night tolerating it  well.  She has outpatient pulmonary clinic follow-up for the BiPAP.  She does eat quite a bit of salt and carbohydrates in her diet.  She has had a chronic  history of taste loss in the past.  She did test negative for Covid.  She does not have a glucose meter at home. cbg 185   bp 140/95  Wt Readings from Last 3 Encounters: 11/18/18 : 295 lb (133.8 kg) 11/14/18 : 295 lb 6 oz (134 kg) 11/07/18 : 285 lb 6.4 oz (129.5 kg)  Note the patient had already been seen by cardiology for post hospital visit and they recommended Entresto and also assistance with medication cost for the [facility]  Patient does have history of asthma and is on the Symbicort and as needed albuterol.  The patient does maintain furosemide 40 mg daily along with Aldactone 25 mg daily amlodipine is now being held.  Patient is also history of use of hydralazine at 50 mg 3 times daily.  Januvia also was recommended by the cardiologist as well. Patient is also on the Coreg 25 mg twice daily  Past Medical History:  Diagnosis Date  . Acute respiratory failure with hypoxia ([place]) 11/01/2018  . Asthma   . Diabetes mellitus without complication ([place])   . Hypertension   . Respiratory failure with hypoxia ([place]) 11/01/2018     Family History  Problem Relation Age of Onset  . Hypertension Mother   . COPD Mother   . Kidney failure Sister      Social History   Socioeconomic History  . Marital status: Single    Spouse name: Not on file  . Number of children: Not on file  . Years of education: Not on file  . Highest education level: Not on file  Occupational History  . Not on file  Social Needs  . Financial resource strain: Not on file  . Food insecurity    Worry: Not on file    Inability: Not on file  . Transportation needs    Medical: Not on file    Non-medical: Not on file  Tobacco Use  . Smoking status: Never Smoker  . Smokeless tobacco: Never Used  Substance and Sexual Activity  . Alcohol use: Not on file  . Drug use: Not on file  . Sexual activity: Not on file  Lifestyle  . Physical activity    Days per week: Not on file    Minutes per session: Not on file  .  Stress: Not on file  Relationships  . Social Herbalist on phone: Not on file    Gets together: Not on file    Attends religious service: Not on file    Active member of club or organization: Not on file    Attends meetings of clubs or organizations: Not on file    Relationship status: Not on file  . Intimate partner violence    Fear of current or ex partner: Not on file    Emotionally abused: Not on file    Physically abused: Not on file    Forced sexual activity: Not on file  Other Topics Concern  . Not on file  Social History Narrative  . Not on file     No Known Allergies   Outpatient Medications Prior to Visit  Medication Sig Dispense Refill  . albuterol (PROVENTIL) (2.5 MG/3ML) 0.083% nebulizer solution Take 3 mLs (2.5 mg total) by nebulization every 6 (six) hours as needed for wheezing or  shortness of breath. 75 mL 12  . albuterol (VENTOLIN HFA) 108 (90 Base) MCG/ACT inhaler Inhale 1-2 puffs into the lungs every 6 (six) hours as needed for wheezing or shortness of breath. 18 g 12  . ipratropium-albuterol (DUONEB) 0.5-2.5 (3) MG/3ML SOLN Take 3 mLs by nebulization every 6 (six) hours as needed (for breathing). 360 mL 12  . budesonide-formoterol (SYMBICORT) 160-4.5 MCG/ACT inhaler Inhale 2 puffs into the lungs 2 (two) times daily. 1 Inhaler 12  . carvedilol (COREG) 25 MG tablet Take 1 tablet (25 mg total) by mouth 2 (two) times daily with a meal. 60 tablet 3  . furosemide (LASIX) 40 MG tablet Take 1 tablet (40 mg total) by mouth daily. 30 tablet 3  . glipiZIDE (GLUCOTROL) 5 MG tablet Take 1 tablet (5 mg total) by mouth 2 (two) times daily. 60 tablet 11  . hydrALAZINE (APRESOLINE) 50 MG tablet Take 1 tablet (50 mg total) by mouth every 8 (eight) hours. 90 tablet 3  . losartan (COZAAR) 100 MG tablet Take 1 tablet (100 mg total) by mouth daily. 30 tablet 3  . montelukast (SINGULAIR) 10 MG tablet Take 1 tablet (10 mg total) by mouth at bedtime. 30 tablet 12  .  spironolactone (ALDACTONE) 25 MG tablet Take 1 tablet (25 mg total) by mouth daily. 30 tablet 3  . amLODipine (NORVASC) 10 MG tablet Take 10 mg by mouth daily.     No facility-administered medications prior to visit.       Review of Systems Constitutional:   No  weight loss, night sweats,  Fevers, chills, fatigue, lassitude. HEENT:   No headaches,  Difficulty swallowing,  Tooth/dental problems,  Sore throat,                No sneezing, itching, ear ache, nasal congestion, post nasal drip,   CV:   chest pain,  Orthopnea, PND, swelling in lower extremities, anasarca, dizziness, palpitations  GI  No heartburn, indigestion, abdominal pain, nausea, vomiting, diarrhea, change in bowel habits, loss of appetite  Resp:  shortness of breath with exertion or at rest.  No excess mucus, no productive cough,  No non-productive cough,  No coughing up of blood.  No change in color of mucus.  No wheezing.  No chest wall deformity  Skin: no rash or lesions.  GU: no dysuria, change in color of urine, no urgency or frequency.  No flank pain.  MS:  No joint pain or swelling.  No decreased range of motion.  No back pain.  Psych:  No change in mood or affect. No depression or anxiety.  No memory loss.     Objective:   Physical Exam Vitals:   11/18/18 1019  BP: (!) 140/95  Pulse: 79  Resp: 18  Temp: 99.1 F (37.3 C)  TempSrc: Oral  SpO2: 100%  Weight: 295 lb (133.8 kg)  Height: 5' 3"  (1.6 m)    Gen: Pleasant, well-nourished, in no distress,  normal affect  ENT: No lesions,  mouth clear,  oropharynx clear, no postnasal drip Poor dentition with periodontal disease  Neck: No JVD, no TMG, no carotid bruits  Lungs: No use of accessory muscles, no dullness to percussion, clear without rales or rhonchi  Cardiovascular: RRR, heart sounds normal, no murmur or gallops, 2+ peripheral edema  Abdomen: soft and NT, no HSM,  BS normal  Musculoskeletal: No deformities, no cyanosis or clubbing   Neuro: alert, non focal  Skin: Warm, no lesions or rashes  FOOT EXAM  Normal pulses and sensation, very dry feet  BMP Latest Ref Rng & Units 11/07/2018 11/06/2018 11/05/2018  Glucose 70 - 99 mg/dL 116(H) 127(H) -  BUN 6 - 20 mg/dL 31(H) 24(H) -  Creatinine 0.44 - 1.00 mg/dL 1.37(H) 1.30(H) -  Sodium 135 - 145 mmol/L 142 142 141  Potassium 3.5 - 5.1 mmol/L 4.0 3.9 4.2  Chloride 98 - 111 mmol/L 101 100 -  CO2 22 - 32 mmol/L 32 35(H) -  Calcium 8.9 - 10.3 mg/dL 9.0 9.0 -   CBC Latest Ref Rng & Units 11/07/2018 11/06/2018 11/05/2018  WBC 4.0 - 10.5 K/uL 10.5 9.7 -  Hemoglobin 12.0 - 15.0 g/dL 12.4 12.8 13.6  Hematocrit 36.0 - 46.0 % 41.7 41.7 40.0  Platelets 150 - 400 K/uL 361 360 -      Assessment & Plan:  I personally reviewed all images and lab data in the [facility] system as well as any outside material available during this office visit and agree with the  radiology impressions.   NICM (nonischemic cardiomyopathy) ([place]) Nonischemic cardiomyopathy with chronic systolic heart failure and prior acute heart failure now resolved  We will discontinue hydralazine and losartan and transition this patient over to the [facility] and obtain for this patient the Entresto starting at a dose of 49/51 1 tablet twice daily  We will maintain Aldactone 25 mg daily furosemide 40 mg daily and Brevital 25 mg twice daily  We will discontinue further amlodipine  Hypertension Hypertension poorly controlled now improved with current heart failure program  Note we will plan to discontinue amlodipine and continue Entresto twice daily and Coreg twice daily  Chronic systolic heart failure (HCC) Chronic systolic heart failure with no evidence of coronary artery disease on recent coronary artery study  Acute respiratory failure with hypoxia (HCC) Acute respiratory failure with hypoxemia improved  We will continue BiPAP for now and follow-up with pulmonary medicine  Right upper lobe pneumonia Right upper lobe  pneumonia improved on exam will need follow-up chest x-ray  Follow-up chest x-ray ordered  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 -     carvedilol (COREG) 25 MG tablet; Take 1 tablet (25 mg total) by mouth 2 (two) times daily with a meal. -     furosemide (LASIX) 40 MG tablet; Take 1  tablet (40 mg total) by mouth daily. -     montelukast (SINGULAIR) 10 MG tablet; Take 1 tablet (10 mg total) by mouth at bedtime. -     spironolactone (ALDACTONE) 25 MG tablet; Take 1 tablet (25 mg total) by mouth daily. -     sitaGLIPtin (JANUVIA) 100 MG tablet; Take 1 tablet (100 mg total) by mouth daily. -     sacubitril-valsartan (ENTRESTO) 49-51 MG; Take 1 tablet by mouth 2 (two) times daily. -     Blood Glucose  Monitoring Suppl (TRUE METRIX METER) w/Device KIT; Use to measure blood sugar twice a day -     TRUEplus Lancets 28G MISC; Use to measure blood sugar twice a day -     glucose blood (TRUE METRIX BLOOD GLUCOSE TEST) test strip; Use as instructed

## 2018-11-18 NOTE — Assessment & Plan Note (Signed)
Nonischemic cardiomyopathy with chronic systolic heart failure and prior acute heart failure now resolved  We will discontinue hydralazine and losartan and transition this patient over to the [facility] and obtain for this patient the Entresto starting at a dose of 49/51 1 tablet twice daily  We will maintain Aldactone 25 mg daily furosemide 40 mg daily and Brevital 25 mg twice daily  We will discontinue further amlodipine

## 2018-11-19 LAB — COMPREHENSIVE METABOLIC PANEL
ALT: 20 IU/L (ref 0–32)
AST: 11 IU/L (ref 0–40)
Albumin/Globulin Ratio: 1.6 (ref 1.2–2.2)
Albumin: 3.9 g/dL (ref 3.8–4.8)
Alkaline Phosphatase: 88 IU/L (ref 39–117)
BUN/Creatinine Ratio: 16 (ref 9–23)
BUN: 23 mg/dL (ref 6–24)
Bilirubin Total: 0.3 mg/dL (ref 0.0–1.2)
CO2: 32 mmol/L — ABNORMAL HIGH (ref 20–29)
Calcium: 9.4 mg/dL (ref 8.7–10.2)
Chloride: 102 mmol/L (ref 96–106)
Creatinine, Ser: 1.44 mg/dL — ABNORMAL HIGH (ref 0.57–1.00)
GFR calc Af Amer: 50 mL/min/{1.73_m2} — ABNORMAL LOW (ref >59)
GFR calc non Af Amer: 43 mL/min/{1.73_m2} — ABNORMAL LOW (ref >59)
Globulin, Total: 2.4 g/dL (ref 1.5–4.5)
Glucose: 136 mg/dL — ABNORMAL HIGH (ref 65–99)
Potassium: 4.3 mmol/L (ref 3.5–5.2)
Sodium: 147 mmol/L — ABNORMAL HIGH (ref 134–144)
Total Protein: 6.3 g/dL (ref 6.0–8.5)

## 2018-11-19 LAB — CBC WITH DIFFERENTIAL/PLATELET
Basophils Absolute: 0.1 10*3/uL (ref 0.0–0.2)
Basos: 0 %
EOS (ABSOLUTE): 0 10*3/uL (ref 0.0–0.4)
Eos: 0 %
Hematocrit: 40.4 % (ref 34.0–46.6)
Hemoglobin: 12.6 g/dL (ref 11.1–15.9)
Immature Grans (Abs): 0.1 10*3/uL (ref 0.0–0.1)
Immature Granulocytes: 1 %
Lymphocytes Absolute: 1.1 10*3/uL (ref 0.7–3.1)
Lymphs: 9 %
MCH: 28.8 pg (ref 26.6–33.0)
MCHC: 31.2 g/dL — ABNORMAL LOW (ref 31.5–35.7)
MCV: 92 fL (ref 79–97)
Monocytes Absolute: 0.7 10*3/uL (ref 0.1–0.9)
Monocytes: 6 %
Neutrophils Absolute: 10.3 10*3/uL — ABNORMAL HIGH (ref 1.4–7.0)
Neutrophils: 84 %
Platelets: 355 10*3/uL (ref 150–450)
RBC: 4.37 x10E6/uL (ref 3.77–5.28)
RDW: 14.1 % (ref 11.7–15.4)
WBC: 12.2 10*3/uL — ABNORMAL HIGH (ref 3.4–10.8)

## 2018-11-21 ENCOUNTER — Telehealth: Payer: Self-pay | Admitting: *Deleted

## 2018-11-21 NOTE — Telephone Encounter (Signed)
Patient verified DOB Patient is aware of labs being stable and to continue with regimen discussed in visit.

## 2018-11-21 NOTE — Telephone Encounter (Signed)
-----   Message from Elsie Stain, MD sent at 11/20/2018  1:34 PM EST ----- Let pt know all labs stable.   CBC normal.   CMET shows kidneys stable at present no changes.   Blood sugar high.  Continue changes and meds as recommended at visit

## 2018-12-02 ENCOUNTER — Telehealth: Payer: Self-pay | Admitting: Cardiovascular Disease

## 2018-12-02 NOTE — Telephone Encounter (Signed)
Left message on Julie Benjamin's personal voice mail Dr.O'Neal's advice.Spoke to patient Dr.O'Neal advised to continue same medications.Advised to keep appointment with him as planned 12/15/18 at 10:20 am.

## 2018-12-02 NOTE — Telephone Encounter (Signed)
New Message    Beth from [place] is calling to report the patients BP 140/108   Please call

## 2018-12-02 NOTE — Telephone Encounter (Signed)
Bps are fine. Keep taking medications as prescribed. -W

## 2018-12-02 NOTE — Telephone Encounter (Signed)
Spoke to [employer] with [place] she was calling to report patient's B/P this morning 140/108 pulse 76 weight 289 lbs.Stated patient did take her medications.B/P has been ranging 124/90,140/98,138/74.Pulse [id number].Advised I will send message to Dr.O'Neal for advice.

## 2018-12-03 ENCOUNTER — Other Ambulatory Visit: Payer: Self-pay

## 2018-12-03 ENCOUNTER — Encounter: Payer: Self-pay | Admitting: Pulmonary Disease

## 2018-12-03 ENCOUNTER — Ambulatory Visit (INDEPENDENT_AMBULATORY_CARE_PROVIDER_SITE_OTHER): Payer: Self-pay | Admitting: Pulmonary Disease

## 2018-12-03 VITALS — BP 138/86 | HR 76 | Temp 97.0°F | Ht 64.0 in | Wt 285.6 lb

## 2018-12-03 DIAGNOSIS — G4733 Obstructive sleep apnea (adult) (pediatric): Secondary | ICD-10-CM

## 2018-12-03 DIAGNOSIS — E662 Morbid (severe) obesity with alveolar hypoventilation: Secondary | ICD-10-CM

## 2018-12-03 MED ORDER — PREDNISONE 10 MG PO TABS: ORAL_TABLET | ORAL | 0 refills | Status: AC

## 2018-12-03 NOTE — Patient Instructions (Signed)
Will arrange for overnight oxygen test and get copy of your Bipap report Will have you sign a release form to get records from your pulmonologist office in [place] Prednisone 10 mg pill >> 3 pills daily for 2 days, 2 pills daily for 2 days, 1 pill daily for 2 days  Follow up in 6 weeks

## 2018-12-03 NOTE — Progress Notes (Signed)
  Subjective:     Patient ID: Julie Benjamin, female   DOB: [birth year], 48 y.o.   MRN: [id number]  HPI   Review of Systems  Constitutional: Positive for appetite change and fatigue.  HENT: Positive for dental problem and rhinorrhea.   Eyes: Negative.   Respiratory: Positive for apnea, cough and wheezing.   Cardiovascular: Positive for chest pain and leg swelling.  Gastrointestinal: Positive for constipation.  Endocrine: Negative.   Genitourinary: Positive for frequency.  Musculoskeletal: Negative.   Skin: Negative.   Allergic/Immunologic: Positive for environmental allergies and food allergies.  Neurological: Negative.   Hematological: Bruises/bleeds easily.  Psychiatric/Behavioral: Negative.        Objective:   Physical Exam     Assessment:         Plan:

## 2018-12-03 NOTE — Progress Notes (Signed)
[facility]  Chief Complaint  Patient presents with  . Consult    Cough with last 30 minutes with clear phlegm. Medications are working.  . OSA (obstructive sleep apnea)    on BIPAP    Constitutional:  BP 138/86 (BP Location: Right Arm, Patient Position: Sitting, Cuff Size: Normal) Comment (Patient Position): forearm  Pulse 76   Temp (!) 97 F (36.1 C)   Ht 5' 4"  (1.626 m)   Wt 285 lb 9.6 oz (129.5 kg)   SpO2 97% Comment: on room air  BMI 49.02 kg/m   Past Medical History:  HTN, DM, non ischemic CM  Brief Summary:  Julie Benjamin is a 48 y.o. female with allergic asthma, OSA and OHS.  She was in hospital earlier this month and seen by pulmonary service.  Recently moved from [place].  Was seen by pulmonary there.  Has long standing history of allergies and asthma.  Has cough with clear sputum.  Having some wheeze.  Not having fever, chest pain, or swelling.  Has Bipap machine from hospital.  Not using oxygen at home.  Had sleep study several years in [place].   Physical Exam:   Appearance - well kempt   ENMT - clear nasal mucosa, midline nasal  septum, no oral exudates, no LAN, trachea midline  Respiratory - normal chest wall, normal respiratory effort, no accessory muscle use, b/l wheezing  CV - s1s2 regular rate and rhythm, no murmurs, no peripheral edema, radial pulses symmetric  GI - soft, non tender, no masses  Lymph - no adenopathy noted in neck and axillary areas  MSK - normal gait  Ext - no cyanosis, clubbing, or joint inflammation noted  Skin - no rashes, lesions, or ulcers  Neuro - normal strength, oriented x 3  Psych - normal mood and affect    Assessment/Plan:   Allergic asthma with acute exacerbation. - prednisone taper - continue symbicort, singulair, prn albuterol - will get records from pulmonary in [place], [place]  Obstructive sleep apnea with obesity hypoventilation syndrome. -  get records from [place], [place] - get [employer] from DME - arrange for ONO with Bipap  Non ischemic CM. - f/u with cardiology   Patient Instructions  Will arrange for overnight oxygen test and get copy of your Bipap report Will have you sign a release form to get records from your pulmonologist office in [facility] Prednisone 10 mg pill >> 3 pills daily for 2 days, 2 pills daily for 2 days, 1 pill daily for 2 days  Follow up in 6 weeks  A total of  29 minutes were spent face to face with the patient and more than half of that time involved counseling or coordination of care.   Chesley Mires, MD [facility] Pager: [phone] 12/03/2018, 11:24 AM  Flow Sheet     Pulmonary tests:    Sleep tests:    Cardiac tests:  Echo 11/02/18 >> EF 25 to 30%, mild LVH, grade 2 DD RHC/LHC 11/05/18 >> no CAD; RV 46/6/13, PA 43/20/35, PCWP 21  Review of Systems:  Constitutional: Positive for appetite change and fatigue.  HENT: Positive for dental problem and rhinorrhea.   Eyes: Negative.   Respiratory: Positive for apnea, cough and wheezing.   Cardiovascular: Positive for chest pain and leg swelling.  Gastrointestinal: Positive for constipation.  Endocrine: Negative.   Genitourinary: Positive for frequency.  Musculoskeletal: Negative.   Skin: Negative.   Allergic/Immunologic: Positive for environmental allergies and  food allergies.  Neurological: Negative.   Hematological: Bruises/bleeds easily.  Psychiatric/Behavioral: Negative.    Medications:   Allergies as of 12/03/2018   No Known Allergies     Medication List       Accurate as of December 03, 2018 11:24 AM. If you have any questions, ask your nurse or doctor.        albuterol (2.5 MG/3ML) 0.083% nebulizer solution Commonly known as: PROVENTIL Take 3 mLs (2.5 mg total) by nebulization every 6 (six) hours as needed for wheezing or shortness of breath.   albuterol 108 (90 Base) MCG/ACT  inhaler Commonly known as: VENTOLIN HFA Inhale 1-2 puffs into the lungs every 6 (six) hours as needed for wheezing or shortness of breath.   budesonide-formoterol 160-4.5 MCG/ACT inhaler Commonly known as: SYMBICORT Inhale 2 puffs into the lungs 2 (two) times daily.   carvedilol 25 MG tablet Commonly known as: COREG Take 1 tablet (25 mg total) by mouth 2 (two) times daily with a meal.   Entresto 49-51 MG Generic drug: sacubitril-valsartan Take 1 tablet by mouth 2 (two) times daily.   furosemide 40 MG tablet Commonly known as: LASIX Take 1 tablet (40 mg total) by mouth daily.   ipratropium-albuterol 0.5-2.5 (3) MG/3ML Soln Commonly known as: DUONEB Take 3 mLs by nebulization every 6 (six) hours as needed (for breathing).   montelukast 10 MG tablet Commonly known as: SINGULAIR Take 1 tablet (10 mg total) by mouth at bedtime.   predniSONE 10 MG tablet Commonly known as: DELTASONE Take 3 tablets (30 mg total) by mouth daily with breakfast for 2 days, THEN 2 tablets (20 mg total) daily with breakfast for 2 days, THEN 1 tablet (10 mg total) daily with breakfast for 2 days. Start taking on: December 03, 2018 Started by: Chesley Mires, MD   sitaGLIPtin 100 MG tablet Commonly known as: Januvia Take 1 tablet (100 mg total) by mouth daily.   spironolactone 25 MG tablet Commonly known as: ALDACTONE Take 1 tablet (25 mg total) by mouth daily.   True Metrix Blood Glucose Test test strip Generic drug: glucose blood Use as instructed   True Metrix Meter w/Device Kit Use to measure blood sugar twice a day   TRUEplus Lancets 28G Misc Use to measure blood sugar twice a day       Past Surgical History:  She  has a past surgical history that includes Cesarean section; Tubal ligation (2001); and RIGHT/LEFT HEART CATH AND CORONARY ANGIOGRAPHY (N/A, 11/05/2018).  Family History:  Her family history includes COPD in her mother; Hypertension in her mother; Kidney failure in her sister.   Social History:  She  reports that she has never smoked. She has never used smokeless tobacco.

## 2018-12-05 ENCOUNTER — Telehealth: Payer: Self-pay | Admitting: General Surgery

## 2018-12-05 MED FILL — !VENTOLIN HFA INHALER: 108 (90 BAS | 25 days supply | Qty: 18 | Fill #0

## 2018-12-05 MED FILL — IPRAT-ALBUT 0.5-3(2.5) MG/3: 0.5-2.5 (3) | 15 days supply | Qty: 180 | Fill #0

## 2018-12-05 MED FILL — ?PREDNISONE 10 MG TABLET: 10 | 6 days supply | Qty: 12 | Fill #0

## 2018-12-05 MED FILL — ?SPIRONOLACTONE 25 MG TABLE: 25 | 30 days supply | Qty: 30 | Fill #0

## 2018-12-05 MED FILL — SYMBICORT 160-4.5 MCG INH: 160-4.5 | 30 days supply | Qty: 10 | Fill #0

## 2018-12-05 MED FILL — ?CARVEDILOL 25MG TABLE: 25 | 30 days supply | Qty: 60 | Fill #0

## 2018-12-05 MED FILL — ALBUTEROL SUL 2.5 MG/3 ML S: (2.5 MG/3ML | 7 days supply | Qty: 90 | Fill #0

## 2018-12-05 MED FILL — ?FUROSEMIDE 40 MG TABLET: 40 | 30 days supply | Qty: 30 | Fill #0

## 2018-12-05 MED FILL — MONTELUKAST SOD 10 MG TAB: 10 | 30 days supply | Qty: 30 | Fill #0

## 2018-12-05 NOTE — Telephone Encounter (Signed)
Attempted to call the patient. Was not able to leave a message, she does not have vmail set up. Per patient during her visit it was Dr.Chrindar Amad in [place], [place]. It is a Geographical information systems officer.  Need to obtain the information she has on the doctor's business card in order to fax the medical release. I have tried multiple times to find this doctor on line without success.

## 2018-12-09 ENCOUNTER — Telehealth: Payer: Self-pay | Admitting: Cardiovascular Disease

## 2018-12-09 NOTE — Telephone Encounter (Signed)
Yes, have her check her BP over the next week. -W

## 2018-12-09 NOTE — Telephone Encounter (Signed)
New message    Julie Benjamin from [facility] calling to report BP .  Pt c/o BP issue: STAT if pt c/o blurred vision, one-sided weakness or slurred speech  1. What are your last 5 BP readings? 170/110 today  2. Are you having any other symptoms (ex. Dizziness, headache, blurred vision, passed out)? fatigue  3. What is your BP issue? Elevated BP

## 2018-12-09 NOTE — Telephone Encounter (Signed)
PT AWARE OF RECOMMENDATIONS ./CY 

## 2018-12-09 NOTE — Telephone Encounter (Signed)
Spoke with pt and B/P reading of 117/110 pt had taken meds only 30 min before Per pt has a working B/p machine and will keep a log and will call next week with readings Also Clair Gulling stated today was his last visit and pt may benefit from outpatient PT Will forward message to Dr Audie Box for review and recommendations ./cy

## 2018-12-14 NOTE — Progress Notes (Signed)
Cardiology Office Note:   Date:  12/15/2018  NAME:  Julie Benjamin    MRN: [id number] DOB:  [birth year]   PCP:  Patient, No Pcp Per  Cardiologist:  Evalina Field, MD   Referring MD: No ref. provider found   Chief Complaint  Patient presents with  . Congestive Heart Failure   History of Present Illness:   Julie Benjamin is a 48 y.o. female with a hx of hypertension, diabetes, nonischemic cardiomyopathy (ejection fraction 25 to 30%, normal left heart catheterization) who presents for follow-up of systolic heart failure.  Blood pressure controlled today.  She reports she is doing quite well.  She is up to walking up to 500 steps twice a day a few times a week.  She denies any chest pain or trouble breathing with this.  She has no lower extreme edema.  Regarding her CPAP use she uses this infrequently.  She does have a blood pressure log with pressures in the 180-190 range at times.  I highly suspect this is related to poor sleep apnea control.  Weights are stable per review of the medical record.  She actually was started on Entresto by her primary care physician.  Seems to be doing very well overall.  Problem List 1. Systolic HF EF 14-78% 29/5621 2. Normal left heart cath (11/05/2018) 3. Diabetes 4. Hypertension  5. Obesity  Past Medical History: Past Medical History:  Diagnosis Date  . Acute respiratory failure with hypoxia ([place]) 11/01/2018  . Asthma   . Diabetes mellitus without complication ([place])   . Hypertension   . Respiratory failure with hypoxia ([place]) 11/01/2018    Past Surgical History: Past Surgical History:  Procedure Laterality Date  . CESAREAN SECTION    . RIGHT/LEFT HEART CATH AND CORONARY ANGIOGRAPHY N/A 11/05/2018   Procedure: RIGHT/LEFT HEART CATH AND CORONARY ANGIOGRAPHY;  Surgeon: Burnell Blanks, MD;  Location: [facility];  Service: Cardiovascular;  Laterality: N/A;  . TUBAL LIGATION  2001    Current Medications: Current Meds  Medication  Sig  . albuterol (PROVENTIL) (2.5 MG/3ML) 0.083% nebulizer solution Take 3 mLs (2.5 mg total) by nebulization every 6 (six) hours as needed for wheezing or shortness of breath.  Marland Kitchen albuterol (VENTOLIN HFA) 108 (90 Base) MCG/ACT inhaler Inhale 1-2 puffs into the lungs every 6 (six) hours as needed for wheezing or shortness of breath.  . Blood Glucose Monitoring Suppl (TRUE METRIX METER) w/Device KIT Use to measure blood sugar twice a day  . budesonide-formoterol (SYMBICORT) 160-4.5 MCG/ACT inhaler Inhale 2 puffs into the lungs 2 (two) times daily.  . carvedilol (COREG) 25 MG tablet Take 1 tablet (25 mg total) by mouth 2 (two) times daily with a meal.  . furosemide (LASIX) 40 MG tablet Take 1 tablet (40 mg total) by mouth daily.  Marland Kitchen glucose blood (TRUE METRIX BLOOD GLUCOSE TEST) test strip Use as instructed  . ipratropium-albuterol (DUONEB) 0.5-2.5 (3) MG/3ML SOLN Take 3 mLs by nebulization every 6 (six) hours as needed (for breathing).  . montelukast (SINGULAIR) 10 MG tablet Take 1 tablet (10 mg total) by mouth at bedtime.  . sitaGLIPtin (JANUVIA) 100 MG tablet Take 1 tablet (100 mg total) by mouth daily.  Marland Kitchen spironolactone (ALDACTONE) 25 MG tablet Take 1 tablet (25 mg total) by mouth daily.  . TRUEplus Lancets 28G MISC Use to measure blood sugar twice a day  . [DISCONTINUED] carvedilol (COREG) 25 MG tablet Take 1 tablet (25 mg total) by mouth 2 (two) times daily  with a meal.  . [DISCONTINUED] sacubitril-valsartan (ENTRESTO) 49-51 MG Take 1 tablet by mouth 2 (two) times daily.  . [DISCONTINUED] spironolactone (ALDACTONE) 25 MG tablet Take 1 tablet (25 mg total) by mouth daily.     Allergies:    Patient has no known allergies.   Social History: Social History   Socioeconomic History  . Marital status: Single    Spouse name: Not on file  . Number of children: Not on file  . Years of education: Not on file  . Highest education level: Not on file  Occupational History  . Not on file  Tobacco  Use  . Smoking status: Never Smoker  . Smokeless tobacco: Never Used  Substance and Sexual Activity  . Alcohol use: Not on file  . Drug use: Not on file  . Sexual activity: Not on file  Other Topics Concern  . Not on file  Social History Narrative  . Not on file   Social Determinants of Health   Financial Resource Strain:   . Difficulty of Paying Living Expenses: Not on file  Food Insecurity:   . Worried About Running Out of Food in the Last Year: Not on file  . Ran Out of Food in the Last Year: Not on file  Transportation Needs:   . Lack of Transportation (Medical): Not on file  . Lack of Transportation (Non-Medical): Not on file  Physical Activity:   . Days of Exercise per Week: Not on file  . Minutes of Exercise per Session: Not on file  Stress:   . Feeling of Stress : Not on file  Social Connections:   . Frequency of Communication with Friends and Family: Not on file  . Frequency of Social Gatherings with Friends and Family: Not on file  . Attends Religious Services: Not on file  . Active Member of Clubs or Organizations: Not on file  . Attends Club or Organization Meetings: Not on file  . Marital Status: Not on file     Family History: The patient's family history includes COPD in her mother; Hypertension in her mother; Kidney failure in her sister.  ROS:   All other ROS reviewed and negative. Pertinent positives noted in the HPI.     EKGs/Labs/Other Studies Reviewed:   The following studies were personally reviewed by me today:  TTE 11/02/2018  1. Left ventricular ejection fraction, by visual estimation, is 25 to 30%. The left ventricle has severely decreased function. There is mildly increased left ventricular hypertrophy.  2. Definity contrast agent was given IV to delineate the left ventricular endocardial borders.  3. Elevated left atrial pressure.  4. Left ventricular diastolic parameters are consistent with Grade II diastolic dysfunction  (pseudonormalization).  5. Severely dilated left ventricular internal cavity size.  6. Global right ventricle has normal systolic function.The right ventricular size is moderately enlarged. No increase in right ventricular wall thickness.  7. Left atrial size was severely dilated.  8. Right atrial size was moderately dilated.  9. The mitral valve is normal in structure. Trace mitral valve regurgitation. 10. The tricuspid valve is not well visualized. Tricuspid valve regurgitation is not demonstrated. 11. The aortic valve is tricuspid. Aortic valve regurgitation is not visualized.  LHC/RHC 11/05/2018  Hemodynamic findings consistent with mild pulmonary hypertension.   1. No angiographic evidence of CAD 2. Elevated filling pressures. (RV 46/6/13, PA 43/20/35, PCWP21, LVEDP 20)  Recommendations: She remains mildly volume overloaded. Will give one dose of IV Lasix tonight. No further ischemic workup.     Recent Labs: 10/31/2018: B Natriuretic Peptide 931.6 11/05/2018: Magnesium 1.8; TSH 0.723 11/18/2018: ALT 20; BUN 23; Creatinine, Ser 1.44; Hemoglobin 12.6; Platelets 355; Potassium 4.3; Sodium 147   Recent Lipid Panel    Component Value Date/Time   CHOL 132 11/05/2018 0312   TRIG 97 11/05/2018 0312   HDL 43 11/05/2018 0312   CHOLHDL 3.1 11/05/2018 0312   VLDL 19 11/05/2018 0312   LDLCALC 70 11/05/2018 0312    Physical Exam:   VS:  BP 136/80 (BP Location: Left Arm, Patient Position: Sitting, Cuff Size: Large)   Pulse 74   Temp (!) 96.6 F (35.9 C)   Ht 5' 4" (1.626 m)   Wt 288 lb (130.6 kg)   BMI 49.44 kg/m    Wt Readings from Last 3 Encounters:  12/15/18 288 lb (130.6 kg)  12/03/18 285 lb 9.6 oz (129.5 kg)  11/18/18 295 lb (133.8 kg)    General: Well nourished, well developed, in no acute distress Heart: Atraumatic, normal size  Eyes: PEERLA, EOMI  Neck: Supple, no JVD Endocrine: No thryomegaly Cardiac: Normal S1, S2; RRR; no murmurs, rubs, or gallops Lungs: Clear to  auscultation bilaterally, no wheezing, rhonchi or rales  Abd: Soft, nontender, no hepatomegaly  Ext: No edema, pulses 2+ Musculoskeletal: No deformities, BUE and BLE strength normal and equal Skin: Warm and dry, no rashes   Neuro: Alert and oriented to person, place, time, and situation, CNII-XII grossly intact, no focal deficits  Psych: Normal mood and affect   ASSESSMENT:   Kortney Holloran is a 48 y.o. female who presents for the following: 1. Chronic systolic heart failure (HCC)   2. Essential hypertension   3. Obesity, morbid, BMI 40.0-49.9 (HCC)     PLAN:   1. Chronic systolic heart failure (HCC), NYHA Class II/III, ACC/AHA Stage C, EF 25-30%, non-ischemia -I suspect this is all related to hypertension.  She was admitted in November and had a normal left heart cath.  Had severely uncontrolled hypertension at that time.  Seems to be doing well. -We will continue aggressive medical management -We will increase her Entresto to 97-103 mg twice daily.  We will continue her carvedilol 25 mg twice daily we will continue Aldactone 25 mg daily we also will continue Lasix 40 mg daily -She will continue to work on diet exercise and weight loss. -We will plan to repeat an echocardiogram in 3 to 6 months to determine if she will need an ICD.  I am hopeful for recovery of her function.  2. Essential hypertension -Increase Entresto as above -I suspect some of her high blood pressure at home related to poorly controlled sleep apnea.  She will try to use her machine more.  3. Obesity, morbid, BMI 40.0-49.9 (HCC) -Counseled importance of diet and exercise  Disposition: Return in about 3 months (around 03/15/2019).  Medication Adjustments/Labs and Tests Ordered: Current medicines are reviewed at length with the patient today.  Concerns regarding medicines are outlined above.  Orders Placed This Encounter  Procedures  . Basic metabolic panel   Meds ordered this encounter  Medications  .  carvedilol (COREG) 25 MG tablet    Sig: Take 1 tablet (25 mg total) by mouth 2 (two) times daily with a meal.    Dispense:  180 tablet    Refill:  3  . spironolactone (ALDACTONE) 25 MG tablet    Sig: Take 1 tablet (25 mg total) by mouth daily.    Dispense:  90 tablet    Refill:    3  . sacubitril-valsartan (ENTRESTO) 97-103 MG    Sig: Take 1 tablet by mouth 2 (two) times daily.    Dispense:  180 tablet    Refill:  1    Patient Instructions  Medication Instructions:   START ENTRESTO 97-103 MG 1 TABLET 2 TIMES A DAY *If you need a refill on your cardiac medications before your next appointment, please call your pharmacy*  Lab Work: Your physician recommends that you return for lab work TODAY:  BMET If you have labs (blood work) drawn today and your tests are completely normal, you will receive your results only by: Marland Kitchen MyChart Message (if you have MyChart) OR . A paper copy in the mail If you have any lab test that is abnormal or we need to change your treatment, we will call you to review the results.  Testing/Procedures: NONE ordered at this time of appointment   Follow-Up: At [facility], you and your health needs are our priority.  As part of our continuing mission to provide you with exceptional heart care, we have created designated Provider Care Teams.  These Care Teams include your primary Cardiologist (physician) and Advanced Practice Providers (APPs -  Physician Assistants and Nurse Practitioners) who all work together to provide you with the care you need, when you need it.  Your next appointment:   3 month(s)  The format for your next appointment:   In Person  Provider:   Eleonore Chiquito, MD  Other Instructions      Signed, Addison Naegeli. Audie Box, [facility]  [address], [place], [place] [address] [phone]  12/15/2018 11:34 AM

## 2018-12-15 ENCOUNTER — Encounter: Payer: Self-pay | Admitting: Cardiovascular Disease

## 2018-12-15 ENCOUNTER — Ambulatory Visit (INDEPENDENT_AMBULATORY_CARE_PROVIDER_SITE_OTHER): Payer: Self-pay | Admitting: Cardiovascular Disease

## 2018-12-15 ENCOUNTER — Other Ambulatory Visit: Payer: Self-pay

## 2018-12-15 ENCOUNTER — Ambulatory Visit: Payer: Self-pay | Admitting: Cardiovascular Disease

## 2018-12-15 VITALS — BP 136/80 | HR 74 | Temp 96.6°F | Ht 64.0 in | Wt 288.0 lb

## 2018-12-15 DIAGNOSIS — I5022 Chronic systolic (congestive) heart failure: Secondary | ICD-10-CM

## 2018-12-15 DIAGNOSIS — I10 Essential (primary) hypertension: Secondary | ICD-10-CM

## 2018-12-15 LAB — BASIC METABOLIC PANEL
BUN/Creatinine Ratio: 20 (ref 9–23)
BUN: 29 mg/dL — ABNORMAL HIGH (ref 6–24)
CO2: 30 mmol/L — ABNORMAL HIGH (ref 20–29)
Calcium: 9.4 mg/dL (ref 8.7–10.2)
Chloride: 102 mmol/L (ref 96–106)
Creatinine, Ser: 1.44 mg/dL — ABNORMAL HIGH (ref 0.57–1.00)
GFR calc Af Amer: 50 mL/min/{1.73_m2} — ABNORMAL LOW (ref >59)
GFR calc non Af Amer: 43 mL/min/{1.73_m2} — ABNORMAL LOW (ref >59)
Glucose: 116 mg/dL — ABNORMAL HIGH (ref 65–99)
Potassium: 4.5 mmol/L (ref 3.5–5.2)
Sodium: 144 mmol/L (ref 134–144)

## 2018-12-15 MED ORDER — ENTRESTO 97-103 MG PO TABS: 1.0000 | ORAL_TABLET | Freq: Two times a day (BID) | ORAL | 1 refills | Status: AC

## 2018-12-15 MED ORDER — SPIRONOLACTONE 25 MG PO TABS: 25.0000 mg | ORAL_TABLET | Freq: Every day | ORAL | 3 refills | Status: DC

## 2018-12-15 MED ORDER — CARVEDILOL 25 MG PO TABS: 25.0000 mg | ORAL_TABLET | Freq: Two times a day (BID) | ORAL | 3 refills | Status: DC

## 2018-12-15 MED FILL — JANUVIA 100 MG TABLET: 100 | 30 days supply | Qty: 30 | Fill #1

## 2018-12-15 MED FILL — ENTRESTO 97 MG-103 MG TAB: 97-103 | 30 days supply | Qty: 60 | Fill #0

## 2018-12-15 NOTE — Patient Instructions (Signed)
Medication Instructions:   START ENTRESTO 97-103 MG 1 TABLET 2 TIMES A DAY *If you need a refill on your cardiac medications before your next appointment, please call your pharmacy*  Lab Work: Your physician recommends that you return for lab work TODAY:  BMET If you have labs (blood work) drawn today and your tests are completely normal, you will receive your results only by: Marland Kitchen MyChart Message (if you have MyChart) OR . A paper copy in the mail If you have any lab test that is abnormal or we need to change your treatment, we will call you to review the results.  Testing/Procedures: NONE ordered at this time of appointment   Follow-Up: At [facility], you and your health needs are our priority.  As part of our continuing mission to provide you with exceptional heart care, we have created designated Provider Care Teams.  These Care Teams include your primary Cardiologist (physician) and Advanced Practice Providers (APPs -  Physician Assistants and Nurse Practitioners) who all work together to provide you with the care you need, when you need it.  Your next appointment:   3 month(s)  The format for your next appointment:   In Person  Provider:   Eleonore Chiquito, MD  Other Instructions

## 2018-12-17 MED FILL — SYMBICORT 160-4.5 MCG INH: 160-4.5 | 30 days supply | Qty: 10 | Fill #0

## 2018-12-18 ENCOUNTER — Telehealth: Payer: Self-pay | Admitting: Cardiovascular Disease

## 2018-12-18 NOTE — Telephone Encounter (Signed)
Called Julie Benjamin. Kidney function stable. Still CKD III. Will back off to every other day with lasix.   Julie Benjamin, [place]  [address], [place], [place] [address] [phone]  8:19 AM

## 2018-12-21 NOTE — Progress Notes (Signed)
Subjective:    Patient ID: Julie Benjamin, female    DOB: 1970-11-09, 48 y.o.   MRN: [id number]  Virtual Visit via Telephone Note  I connected with Julie Benjamin on 12/22/18 at 10:00 AM EST by telephone and verified that I am speaking with the correct person using two identifiers.   Consent:  I discussed the limitations, risks, security and privacy concerns of performing an evaluation and management service by telephone and the availability of in person appointments. I also discussed with the patient that there may be a patient responsible charge related to this service. The patient expressed understanding and agreed to proceed.  Location of patient: Patient was at home  Location of provider: I was in my office  Persons participating in the televisit with the patient.   No one else on the call    History of Present Illness: This is a 48 year old female who is seen in post hospital follow-up and also wishing to establish for primary care Patient was admitted between 30 October and 6 November with acute respiratory failure and associated new onset nonischemic cardiomyopathy, hypertension, diabetes.  The patient also was found to have right upper lobe pneumonia as well as renal insufficiency.  The patient does have history of longstanding hypertension in the past.  The hypertension was pre-existing however the heart failure is a new diagnosis.  She just moved here from [place] in [place] to established here in the [place] area  Below is a copy of the discharge summary Admit date: 10/31/2018 Discharge date: 11/07/2018  Time spent: 35 minutes  Recommendations for Outpatient Follow-up:  1. New medications as below and have been explained to patient 2. Needs Chem-12 CBC in about 1 to 2 weeks 3. BiPAP being arranged will need outpatient titration and possible pulmonary follow-up in the outpatient setting for sleep study and I will give her the details for Dr. Cordelia Pen clinic at  [facility] pulmonology for this to be arranged 4. Recommend medical weight loss versus gastric outlet procedure  Discharge Diagnoses:  Principal Problem:   Acute respiratory failure with hypoxia (HCC) Active Problems:   Hypertension   Diabetes mellitus without complication (HCC)   Asthma   Right upper lobe pneumonia   Renal insufficiency   History of DVT of lower extremity   NICM (nonischemic cardiomyopathy) ([place])   Discharge Condition: Improved  Diet recommendation: Heart healthy  Filed Weights  11/05/18 0300 11/06/18 0442 11/07/18 0530 Weight: 129.3 kg 128.4 kg 129.5 kg   History of present illness:  48 year old Fem known asthma, DM TY 2, hypertension admitted 10/31with DOE-developed on 11/1 ???wob,encephalopathic with elevated CO2 found to have right upper lobe infiltrate and pulmonology consulted-placed on BiPAP cardiology was consulted as she had new onset systolic heart failure EF 25-30% on echo 11/02/2018 She was diuresed   Hospital Course:  Acute systolic diastolic heart failure Cardiac cath11/4 showed no Coronary obst-meds on discharge included Coreg hydralazine Aldactone amlodipine and Lasix Outpatient follow-up needed regarding titration of meds etc. Cardiomyopathy As above Body mass index is 50.15 kg/m. OSA not on CPAP Patient will need this set up on d/c-CM is awareand will coordinate We will arrange for outpatient sleep study in addition to titration none management of the same with pulmonology Mild hypokalemia Replaced 11/4--labs stable Right upper lobe infiltrate rec'd Abx until 11/1--felt non infectious--no sputum no s/sym per patient and antibiotics were discontinued HTN poorly controlled Meds as above Remote RLE DVT Will need OP follow up and monitoring CKD ii styable at  this time-monitor on dirurectics and aldactone DM ty ii a1c 8.5 cotinue SSI, CBg ranges 120-140 Tells me on discharge  that she has been taking Metformin in the outpatient setting but has not taken it in over a year her A1c is 8.5 Technically she qualifies for insulin with an A1c above 8 however she wants to try to lose some weight and attempt to use oral medication glipizide instead of Metformin I have counseled her regarding hypoglycemia  Consultants:  Cardiology  Critical care Procedures:  Echo  Cardiac cath 11/4  Note today her CBG was 185 blood pressure was 140/95.  Her weight actually is up 10 pounds from discharge.  She does have a BiPAP machine she is wearing this at night tolerating it well.  She has outpatient pulmonary clinic follow-up for the BiPAP.  She does eat quite a bit of salt and carbohydrates in her diet.  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  She did test negative for Covid.  She does not have a glucose meter at home. cbg 185   bp 140/95  Wt Readings from Last 3 Encounters: 11/18/18 : 295 lb (133.8 kg) 11/14/18 : 295 lb 6 oz (134 kg) 11/07/18 : 285 lb 6.4 oz (129.5 kg)  Note the patient had already been seen by cardiology for post hospital visit and they recommended Entresto and also assistance with medication cost for the [facility]  Patient does have history of asthma and is on the Symbicort and as needed albuterol.  The patient does maintain furosemide 40 mg daily along with Aldactone 25 mg daily amlodipine is now being held.  Patient is also history of use of hydralazine at 50 mg 3 times daily.  Januvia also was recommended by the cardiologist as well. Patient is also on the Coreg 25 mg twice daily  12/21  Since the last visit the patient's been in with cardiology for follow-up and the Entresto dose has been increased.  Recommendations to be more compliant with BiPAP was made.  The patient is also been with the sleep medicine physician who recommended adjustments in the BiPAP pressures and a follow-up split-night study Patient is more compliant with her  blood pressure program with improved blood pressure control at home.  In addition to the [facility] she is also on the Aldactone and furosemide along with Coreg We did recommend a chest x-ray she is yet to achieve this.  She is also yet to go to the dentist.  She had previous right upper lobe pneumonia which appears to have resolved.  She states her breathing is improved.  She still has some cough at night on the BiPAP.  She is compliant with her medications at this time.  She does have access to the [place].  She is on Januvia for diabetes with blood sugar control in the 100-1 20 range    Past Medical History:  Diagnosis Date  . Acute respiratory failure with hypoxia ([place]) 11/01/2018  . Asthma   . Diabetes mellitus without complication ([place])   . Hypertension   . Respiratory failure with hypoxia ([place]) 11/01/2018     Family History  Problem Relation Age of Onset  . Hypertension Mother   . COPD Mother   . Kidney failure Sister      Social History   Socioeconomic History  . Marital status: Single    Spouse name: Not on file  . Number of children: Not on file  . Years of education: Not  on file  . Highest education level: Not on file  Occupational History  . Not on file  Tobacco Use  . Smoking status: Never Smoker  . Smokeless tobacco: Never Used  Substance and Sexual Activity  . Alcohol use: Not on file  . Drug use: Not on file  . Sexual activity: Not on file  Other Topics Concern  . Not on file  Social History Narrative  . Not on file   Social Determinants of Health   Financial Resource Strain:   . Difficulty of Paying Living Expenses: Not on file  Food Insecurity:   . Worried About Charity fundraiser in the Last Year: Not on file  . Ran Out of Food in the Last Year: Not on file  Transportation Needs:   . Lack of Transportation (Medical): Not on file  . Lack of Transportation (Non-Medical): Not on file  Physical Activity:   . Days of Exercise per Week: Not on file    . Minutes of Exercise per Session: Not on file  Stress:   . Feeling of Stress : Not on file  Social Connections:   . Frequency of Communication with Friends and Family: Not on file  . Frequency of Social Gatherings with Friends and Family: Not on file  . Attends Religious Services: Not on file  . Active Member of Clubs or Organizations: Not on file  . Attends Archivist Meetings: Not on file  . Marital Status: Not on file  Intimate Partner Violence:   . Fear of Current or Ex-Partner: Not on file  . Emotionally Abused: Not on file  . Physically Abused: Not on file  . Sexually Abused: Not on file     No Known Allergies   Outpatient Medications Prior to Visit  Medication Sig Dispense Refill  . albuterol (PROVENTIL) (2.5 MG/3ML) 0.083% nebulizer solution Take 3 mLs (2.5 mg total) by nebulization every 6 (six) hours as needed for wheezing or shortness of breath. 75 mL 12  . albuterol (VENTOLIN HFA) 108 (90 Base) MCG/ACT inhaler Inhale 1-2 puffs into the lungs every 6 (six) hours as needed for wheezing or shortness of breath. 18 g 12  . Blood Glucose Monitoring Suppl (TRUE METRIX METER) w/Device KIT Use to measure blood sugar twice a day 1 kit 0  . carvedilol (COREG) 25 MG tablet Take 1 tablet (25 mg total) by mouth 2 (two) times daily with a meal. 180 tablet 3  . furosemide (LASIX) 40 MG tablet Take 1 tablet (40 mg total) by mouth daily. 30 tablet 3  . glucose blood (TRUE METRIX BLOOD GLUCOSE TEST) test strip Use as instructed 100 each 12  . ipratropium-albuterol (DUONEB) 0.5-2.5 (3) MG/3ML SOLN Take 3 mLs by nebulization every 6 (six) hours as needed (for breathing). 360 mL 12  . montelukast (SINGULAIR) 10 MG tablet Take 1 tablet (10 mg total) by mouth at bedtime. 30 tablet 12  . sacubitril-valsartan (ENTRESTO) 97-103 MG Take 1 tablet by mouth 2 (two) times daily. 180 tablet 1  . sitaGLIPtin (JANUVIA) 100 MG tablet Take 1 tablet (100 mg total) by mouth daily. 30 tablet 4  .  spironolactone (ALDACTONE) 25 MG tablet Take 1 tablet (25 mg total) by mouth daily. 90 tablet 3  . TRUEplus Lancets 28G MISC Use to measure blood sugar twice a day 100 each 1  . budesonide-formoterol (SYMBICORT) 160-4.5 MCG/ACT inhaler Inhale 2 puffs into the lungs 2 (two) times daily. 1 Inhaler 12   No facility-administered medications  prior to visit.      Review of Systems Constitutional:   No  weight loss, night sweats,  Fevers, chills, fatigue, lassitude. HEENT:   No headaches,  Difficulty swallowing,  Tooth/dental problems,  Sore throat,                No sneezing, itching, ear ache, nasal congestion, post nasal drip,   CV:   chest pain,  Orthopnea, PND, swelling in lower extremities, anasarca, dizziness, palpitations  GI  No heartburn, indigestion, abdominal pain, nausea, vomiting, diarrhea, change in bowel habits, loss of appetite  Resp:  shortness of breath with exertion or at rest.  No excess mucus, no productive cough,  No non-productive cough,  No coughing up of blood.  No change in color of mucus.  No wheezing.  No chest wall deformity  Skin: no rash or lesions.  GU: no dysuria, change in color of urine, no urgency or frequency.  No flank pain.  MS:  No joint pain or swelling.  No decreased range of motion.  No back pain.  Psych:  No change in mood or affect. No depression or anxiety.  No memory loss.     Objective:   Physical Exam Vitals:   12/22/18 1009  Weight: 288 lb (130.6 kg)   No exam as this was a phone visit BMP Latest Ref Rng & Units 12/15/2018 11/18/2018 11/07/2018  Glucose 65 - 99 mg/dL 116(H) 136(H) 116(H)  BUN 6 - 24 mg/dL 29(H) 23 31(H)  Creatinine 0.57 - 1.00 mg/dL 1.44(H) 1.44(H) 1.37(H)  BUN/Creat Ratio 9 - 23 20 16  -  Sodium 134 - 144 mmol/L 144 147(H) 142  Potassium 3.5 - 5.2 mmol/L 4.5 4.3 4.0  Chloride 96 - 106 mmol/L 102 102 101  CO2 20 - 29 mmol/L 30(H) 32(H) 32  Calcium 8.7 - 10.2 mg/dL 9.4 9.4 9.0   CBC Latest Ref Rng & Units  11/18/2018 11/07/2018 11/06/2018  WBC 3.4 - 10.8 x10E3/uL 12.2(H) 10.5 9.7  Hemoglobin 11.1 - 15.9 g/dL 12.6 12.4 12.8  Hematocrit 34.0 - 46.6 % 40.4 41.7 41.7  Platelets 150 - 450 x10E3/uL 355 361 360      Assessment & Plan:  I personally reviewed all images and lab data in the [facility] system as well as any outside material available during this office visit and agree with the  radiology impressions.   Chronic systolic heart failure (HCC) Chronic systolic heart failure due to nonischemic cardiomyopathy and hypertension  We will continue Entresto at the current dose level which was recently increased by cardiology and we will continue to provide patient assistance from our pharmacy for the [facility], continue Aldactone and furosemide at current dose levels recent be met was observed to have normal potassium levels normal renal function  Hypertension Based on recent blood pressure readings blood pressure stable at this time  Asthma Continue Symbicort twice daily and Singulair  Right upper lobe pneumonia Suspect clearance of right upper lobe pneumonia will patient will follow up with chest x-ray  Type 2 diabetes mellitus ([place]) Patient does have urinary frequency will get urinalysis urine culture and check microalbumin continue Januvia   [place] was seen today for diabetes and hypertension.  Diagnoses and all orders for this visit:  Dysuria -     Urinalysis, Complete -     Urine Culture; Future  Type 2 diabetes mellitus without complication, without long-term current use of insulin (HCC) -     Microalbumin/Creatinine Ratio, Urine; Future  Pneumonia of right  upper lobe due to infectious organism -     DG Chest 2 View; Future  Chronic systolic heart failure (HCC)  Essential hypertension  Asthma, unspecified asthma severity, unspecified whether complicated, unspecified whether persistent       Follow Up Instructions: Patient knows a follow-up exam will be scheduled in  January This will be with a new primary care provider and will obtain for the patient a Pap smear at that visit   I discussed the assessment and treatment plan with the patient. The patient was provided an opportunity to ask questions and all were answered. The patient agreed with the plan and demonstrated an understanding of the instructions.   The patient was advised to call back or seek an in-person evaluation if the symptoms worsen or if the condition fails to improve as anticipated.  I provided 30 minutes of non-face-to-face time during this encounter  including  median intraservice time , review of notes, labs, imaging, medications  and explaining diagnosis and management to the patient .    Asencion Noble, MD

## 2018-12-22 ENCOUNTER — Other Ambulatory Visit: Payer: Self-pay

## 2018-12-22 ENCOUNTER — Ambulatory Visit: Payer: Self-pay | Attending: Critical Care Medicine | Admitting: Critical Care Medicine

## 2018-12-22 ENCOUNTER — Encounter: Payer: Self-pay | Admitting: Critical Care Medicine

## 2018-12-22 VITALS — Wt 288.0 lb

## 2018-12-22 DIAGNOSIS — I130 Hypertensive heart and chronic kidney disease with heart failure and stage 1 through stage 4 chronic kidney disease, or unspecified chronic kidney disease: Secondary | ICD-10-CM

## 2018-12-22 DIAGNOSIS — J45909 Unspecified asthma, uncomplicated: Secondary | ICD-10-CM

## 2018-12-22 DIAGNOSIS — I10 Essential (primary) hypertension: Secondary | ICD-10-CM

## 2018-12-22 DIAGNOSIS — N182 Chronic kidney disease, stage 2 (mild): Secondary | ICD-10-CM

## 2018-12-22 DIAGNOSIS — Z8701 Personal history of pneumonia (recurrent): Secondary | ICD-10-CM

## 2018-12-22 DIAGNOSIS — I5022 Chronic systolic (congestive) heart failure: Secondary | ICD-10-CM

## 2018-12-22 DIAGNOSIS — E119 Type 2 diabetes mellitus without complications: Secondary | ICD-10-CM

## 2018-12-22 DIAGNOSIS — Z7951 Long term (current) use of inhaled steroids: Secondary | ICD-10-CM

## 2018-12-22 DIAGNOSIS — I428 Other cardiomyopathies: Secondary | ICD-10-CM

## 2018-12-22 DIAGNOSIS — J189 Pneumonia, unspecified organism: Secondary | ICD-10-CM

## 2018-12-22 DIAGNOSIS — Z79899 Other long term (current) drug therapy: Secondary | ICD-10-CM

## 2018-12-22 DIAGNOSIS — Z794 Long term (current) use of insulin: Secondary | ICD-10-CM

## 2018-12-22 DIAGNOSIS — G4733 Obstructive sleep apnea (adult) (pediatric): Secondary | ICD-10-CM

## 2018-12-22 DIAGNOSIS — R300 Dysuria: Secondary | ICD-10-CM

## 2018-12-22 DIAGNOSIS — E1122 Type 2 diabetes mellitus with diabetic chronic kidney disease: Secondary | ICD-10-CM

## 2018-12-22 NOTE — Assessment & Plan Note (Signed)
Suspect clearance of right upper lobe pneumonia will patient will follow up with chest x-ray

## 2018-12-22 NOTE — Assessment & Plan Note (Signed)
Based on recent blood pressure readings blood pressure stable at this time

## 2018-12-22 NOTE — Assessment & Plan Note (Signed)
Chronic systolic heart failure due to nonischemic cardiomyopathy and hypertension  We will continue Entresto at the current dose level which was recently increased by cardiology and we will continue to provide patient assistance from our pharmacy for the [facility], continue Aldactone and furosemide at current dose levels recent be met was observed to have normal potassium levels normal renal function

## 2018-12-22 NOTE — Progress Notes (Signed)
Pt states her blood sugar this morning was 77

## 2018-12-22 NOTE — Assessment & Plan Note (Signed)
Continue Symbicort twice daily and Singulair

## 2018-12-22 NOTE — Assessment & Plan Note (Signed)
Patient does have urinary frequency will get urinalysis urine culture and check microalbumin continue Januvia

## 2019-01-15 ENCOUNTER — Telehealth: Payer: Self-pay | Admitting: General Practice

## 2019-01-15 NOTE — Telephone Encounter (Signed)
Called the pt and made an appointment for 10:30 on the 8th of feb with Dr. Alvis Lemmings

## 2019-01-15 NOTE — Telephone Encounter (Signed)
She needs return OV early Feb with any PCP to establish with one of our PCPs

## 2019-01-15 NOTE — Telephone Encounter (Signed)
Patient called asking when the provider wanted her back. Please inform me so I can contact her and make and appointment

## 2019-01-16 MED FILL — ?FUROSEMIDE 40 MG TABLET: 40 | 30 days supply | Qty: 30 | Fill #1

## 2019-01-16 MED FILL — MONTELUKAST SOD 10 MG TAB: 10 | 30 days supply | Qty: 30 | Fill #1

## 2019-01-16 MED FILL — ?SPIRONOLACTONE 25 MG TABLE: 25 | 30 days supply | Qty: 30 | Fill #1

## 2019-01-16 MED FILL — ?CARVEDILOL 25MG TABLE: 25 | 30 days supply | Qty: 60 | Fill #1

## 2019-01-22 MED FILL — ENTRESTO 97 MG-103 MG TAB: 97-103 | 30 days supply | Qty: 60 | Fill #1

## 2019-01-22 MED FILL — SYMBICORT 160-4.5 MCG INH: 160-4.5 | 30 days supply | Qty: 10 | Fill #1

## 2019-01-23 MED FILL — !JANUVIA 100MG TABLET: 100 | 30 days supply | Qty: 30 | Fill #2

## 2019-02-09 ENCOUNTER — Ambulatory Visit: Payer: Self-pay | Admitting: Family Medicine

## 2019-03-15 NOTE — Progress Notes (Deleted)
Cardiology Office Note:   Date:  03/15/2019  NAME:  Julie Benjamin    MRN: [id number] DOB:  February 06, 1970   PCP:  Patient, No Pcp Per  Cardiologist:  Reatha Harps, MD  Electrophysiologist:  None   Referring MD: No ref. provider found   No chief complaint on file. ***  History of Present Illness:   Julie Benjamin is a 49 y.o. female with a hx of systolic HF, HTN, DM, obesity who presents for follow-up of CHF.  Problem List 1. Systolic HF EF 25-30% 11/2018 -Normal left heart cath (11/05/2018) 2. Diabetes -A1c 8.5 -T chol 132, HDL 43, LDL 70, TG 97 3. Hypertension  4. Obesity  Past Medical History: Past Medical History:  Diagnosis Date  . Acute respiratory failure with hypoxia (HCC) 11/01/2018  . Asthma   . Diabetes mellitus without complication (HCC)   . Hypertension   . Respiratory failure with hypoxia (HCC) 11/01/2018    Past Surgical History: Past Surgical History:  Procedure Laterality Date  . CESAREAN SECTION    . RIGHT/LEFT HEART CATH AND CORONARY ANGIOGRAPHY N/A 11/05/2018   Procedure: RIGHT/LEFT HEART CATH AND CORONARY ANGIOGRAPHY;  Surgeon: Kathleene Hazel, MD;  Location: [facility] INVASIVE CV LAB;  Service: Cardiovascular;  Laterality: N/A;  . TUBAL LIGATION  2001    Current Medications: No outpatient medications have been marked as taking for the 03/16/19 encounter (Appointment) with O'Neal, Ronnald Ramp, MD.     Allergies:    Patient has no known allergies.   Social History: Social History   Socioeconomic History  . Marital status: Single    Spouse name: Not on file  . Number of children: Not on file  . Years of education: Not on file  . Highest education level: Not on file  Occupational History  . Not on file  Tobacco Use  . Smoking status: Never Smoker  . Smokeless tobacco: Never Used  Substance and Sexual Activity  . Alcohol use: Not on file  . Drug use: Not on file  . Sexual activity: Not on file  Other Topics Concern  . Not on file    Social History Narrative  . Not on file   Social Determinants of Health   Financial Resource Strain:   . Difficulty of Paying Living Expenses:   Food Insecurity:   . Worried About Programme researcher, broadcasting/film/video in the Last Year:   . Barista in the Last Year:   Transportation Needs:   . Freight forwarder (Medical):   Marland Kitchen Lack of Transportation (Non-Medical):   Physical Activity:   . Days of Exercise per Week:   . Minutes of Exercise per Session:   Stress:   . Feeling of Stress :   Social Connections:   . Frequency of Communication with Friends and Family:   . Frequency of Social Gatherings with Friends and Family:   . Attends Religious Services:   . Active Member of Clubs or Organizations:   . Attends Banker Meetings:   Marland Kitchen Marital Status:      Family History: The patient's ***family history includes COPD in her mother; Hypertension in her mother; Kidney failure in her sister.  ROS:   All other ROS reviewed and negative. Pertinent positives noted in the HPI.     EKGs/Labs/Other Studies Reviewed:   The following studies were personally reviewed by me today:  EKG:  EKG is *** ordered today.  The ekg ordered today demonstrates ***, and was personally reviewed  by me.   TTE 11/02/2018 1. Left ventricular ejection fraction, by visual estimation, is 25 to 30%. The left ventricle has severely decreased function. There is mildly increased left ventricular hypertrophy. 2. Definity contrast agent was given IV to delineate the left ventricular endocardial borders. 3. Elevated left atrial pressure. 4. Left ventricular diastolic parameters are consistent with Grade II diastolic dysfunction (pseudonormalization). 5. Severely dilated left ventricular internal cavity size. 6. Global right ventricle has normal systolic function.The right ventricular size is moderately enlarged. No increase in right ventricular wall thickness. 7. Left atrial size was severely  dilated. 8. Right atrial size was moderately dilated. 9. The mitral valve is normal in structure. Trace mitral valve regurgitation. 10. The tricuspid valve is not well visualized. Tricuspid valve regurgitation is not demonstrated. 11. The aortic valve is tricuspid. Aortic valve regurgitation is not visualized.  LHC/RHC 11/05/2018  Hemodynamic findings consistent with mild pulmonary hypertension.  1. No angiographic evidence of CAD 2. Elevated filling pressures. (RV 46/6/13, PA 43/20/35, PCWP21, LVEDP 20)  Recent Labs: 10/31/2018: B Natriuretic Peptide 931.6 11/05/2018: Magnesium 1.8; TSH 0.723 11/18/2018: ALT 20; Hemoglobin 12.6; Platelets 355 12/15/2018: BUN 29; Creatinine, Ser 1.44; Potassium 4.5; Sodium 144   Recent Lipid Panel    Component Value Date/Time   CHOL 132 11/05/2018 0312   TRIG 97 11/05/2018 0312   HDL 43 11/05/2018 0312   CHOLHDL 3.1 11/05/2018 0312   VLDL 19 11/05/2018 0312   LDLCALC 70 11/05/2018 0312    Physical Exam:   VS:  There were no vitals taken for this visit.   Wt Readings from Last 3 Encounters:  12/22/18 288 lb (130.6 kg)  12/15/18 288 lb (130.6 kg)  12/03/18 285 lb 9.6 oz (129.5 kg)    General: Well nourished, well developed, in no acute distress Heart: Atraumatic, normal size  Eyes: PEERLA, EOMI  Neck: Supple, no JVD Endocrine: No thryomegaly Cardiac: Normal S1, S2; RRR; no murmurs, rubs, or gallops Lungs: Clear to auscultation bilaterally, no wheezing, rhonchi or rales  Abd: Soft, nontender, no hepatomegaly  Ext: No edema, pulses 2+ Musculoskeletal: No deformities, BUE and BLE strength normal and equal Skin: Warm and dry, no rashes   Neuro: Alert and oriented to person, place, time, and situation, CNII-XII grossly intact, no focal deficits  Psych: Normal mood and affect   ASSESSMENT:   Julie Benjamin is a 49 y.o. female who presents for the following: No diagnosis found.  PLAN:   There are no diagnoses linked to this  encounter.  Disposition: No follow-ups on file.  Medication Adjustments/Labs and Tests Ordered: Current medicines are reviewed at length with the patient today.  Concerns regarding medicines are outlined above.  No orders of the defined types were placed in this encounter.  No orders of the defined types were placed in this encounter.   There are no Patient Instructions on file for this visit.   Time Spent with Patient: I have spent a total of *** minutes with patient reviewing hospital notes, telemetry, EKGs, labs and examining the patient as well as establishing an assessment and plan that was discussed with the patient.  > 50% of time was spent in direct patient care.  Signed, Addison Naegeli. Audie Box, [place]  [address], [place], [place] [address] [phone]  03/15/2019 5:00 PM

## 2019-03-16 ENCOUNTER — Ambulatory Visit: Payer: Self-pay | Admitting: Cardiovascular Disease

## 2019-03-16 MED FILL — ?CARVEDILOL 25 MG TABLET: 25 | 30 days supply | Qty: 60 | Fill #2

## 2019-03-16 MED FILL — !JANUVIA 100MG TABLET: 100 | 30 days supply | Qty: 30 | Fill #3

## 2019-03-16 MED FILL — SYMBICORT 160-4.5 MCG INH: 160-4.5 | 30 days supply | Qty: 10 | Fill #2

## 2019-03-16 MED FILL — MONTELUKAST SOD 10 MG TAB: 10 | 30 days supply | Qty: 30 | Fill #2

## 2019-03-16 MED FILL — !VENTOLIN HFA INHALER: 108 (90 BAS | 25 days supply | Qty: 18 | Fill #1

## 2019-03-16 MED FILL — ?SPIRONOLACTONE 25 MG TABLE: 25 | 30 days supply | Qty: 30 | Fill #2

## 2019-03-16 MED FILL — ?FUROSEMIDE 40 MG TABLET: 40 | 30 days supply | Qty: 30 | Fill #2

## 2019-03-24 ENCOUNTER — Encounter: Payer: Self-pay | Admitting: Cardiovascular Disease

## 2019-05-28 NOTE — Progress Notes (Deleted)
Cardiology Office Note:   Date:  05/28/2019  NAME:  Julie Benjamin    MRN: [id number] DOB:  1970-04-01   PCP:  Patient, No Pcp Per  Cardiologist:  Reatha Harps, MD  Electrophysiologist:  None   Referring MD: No ref. provider found   No chief complaint on file. ***  History of Present Illness:   Julie Benjamin is a 49 y.o. female with a hx of CHF, DM, HTN, OSA who presents for follow-up of CHF.    Problem List 1. Systolic HF EF 25-30% 11/2018 2. Normal left heart cath (11/05/2018) 3. Diabetes -A1c 8.5 -T chol 132, HDL 43, LDL 70, TG 97 4. Hypertension  5. Obesity 6. OSA  Past Medical History: Past Medical History:  Diagnosis Date  . Acute respiratory failure with hypoxia (HCC) 11/01/2018  . Asthma   . Diabetes mellitus without complication (HCC)   . Hypertension   . Respiratory failure with hypoxia (HCC) 11/01/2018    Past Surgical History: Past Surgical History:  Procedure Laterality Date  . CESAREAN SECTION    . RIGHT/LEFT HEART CATH AND CORONARY ANGIOGRAPHY N/A 11/05/2018   Procedure: RIGHT/LEFT HEART CATH AND CORONARY ANGIOGRAPHY;  Surgeon: Kathleene Hazel, MD;  Location: [facility];  Service: Cardiovascular;  Laterality: N/A;  . TUBAL LIGATION  2001    Current Medications: No outpatient medications have been marked as taking for the 05/29/19 encounter (Appointment) with O'Neal, Ronnald Ramp, MD.     Allergies:    Patient has no known allergies.   Social History: Social History   Socioeconomic History  . Marital status: Single    Spouse name: Not on file  . Number of children: Not on file  . Years of education: Not on file  . Highest education level: Not on file  Occupational History  . Not on file  Tobacco Use  . Smoking status: Never Smoker  . Smokeless tobacco: Never Used  Substance and Sexual Activity  . Alcohol use: Not on file  . Drug use: Not on file  . Sexual activity: Not on file  Other Topics Concern  . Not on file   Social History Narrative  . Not on file   Social Determinants of Health   Financial Resource Strain:   . Difficulty of Paying Living Expenses:   Food Insecurity:   . Worried About Programme researcher, broadcasting/film/video in the Last Year:   . Barista in the Last Year:   Transportation Needs:   . Freight forwarder (Medical):   Marland Kitchen Lack of Transportation (Non-Medical):   Physical Activity:   . Days of Exercise per Week:   . Minutes of Exercise per Session:   Stress:   . Feeling of Stress :   Social Connections:   . Frequency of Communication with Friends and Family:   . Frequency of Social Gatherings with Friends and Family:   . Attends Religious Services:   . Active Member of Clubs or Organizations:   . Attends Banker Meetings:   Marland Kitchen Marital Status:      Family History: The patient's ***family history includes COPD in her mother; Hypertension in her mother; Kidney failure in her sister.  ROS:   All other ROS reviewed and negative. Pertinent positives noted in the HPI.     EKGs/Labs/Other Studies Reviewed:   The following studies were personally reviewed by me today:  EKG:  EKG is *** ordered today.  The ekg ordered today demonstrates ***, and  was personally reviewed by me.   TTE 11/02/2018 1. Left ventricular ejection fraction, by visual estimation, is 25 to  30%. The left ventricle has severely decreased function. There is mildly  increased left ventricular hypertrophy.  2. Definity contrast agent was given IV to delineate the left ventricular  endocardial borders.  3. Elevated left atrial pressure.  4. Left ventricular diastolic parameters are consistent with Grade II  diastolic dysfunction (pseudonormalization).  5. Severely dilated left ventricular internal cavity size.  6. Global right ventricle has normal systolic function.The right  ventricular size is moderately enlarged. No increase in right ventricular  wall thickness.  7. Left atrial size was  severely dilated.  8. Right atrial size was moderately dilated.  9. The mitral valve is normal in structure. Trace mitral valve  regurgitation.  10. The tricuspid valve is not well visualized. Tricuspid valve  regurgitation is not demonstrated.  11. The aortic valve is tricuspid. Aortic valve regurgitation is not  visualized.   LHC 11/05/2018 -> normal coronary arteries.    Recent Labs: 10/31/2018: B Natriuretic Peptide 931.6 11/05/2018: Magnesium 1.8; TSH 0.723 11/18/2018: ALT 20; Hemoglobin 12.6; Platelets 355 12/15/2018: BUN 29; Creatinine, Ser 1.44; Potassium 4.5; Sodium 144   Recent Lipid Panel    Component Value Date/Time   CHOL 132 11/05/2018 0312   TRIG 97 11/05/2018 0312   HDL 43 11/05/2018 0312   CHOLHDL 3.1 11/05/2018 0312   VLDL 19 11/05/2018 0312   LDLCALC 70 11/05/2018 0312    Physical Exam:   VS:  There were no vitals taken for this visit.   Wt Readings from Last 3 Encounters:  12/22/18 288 lb (130.6 kg)  12/15/18 288 lb (130.6 kg)  12/03/18 285 lb 9.6 oz (129.5 kg)    General: Well nourished, well developed, in no acute distress Heart: Atraumatic, normal size  Eyes: PEERLA, EOMI  Neck: Supple, no JVD Endocrine: No thryomegaly Cardiac: Normal S1, S2; RRR; no murmurs, rubs, or gallops Lungs: Clear to auscultation bilaterally, no wheezing, rhonchi or rales  Abd: Soft, nontender, no hepatomegaly  Ext: No edema, pulses 2+ Musculoskeletal: No deformities, BUE and BLE strength normal and equal Skin: Warm and dry, no rashes   Neuro: Alert and oriented to person, place, time, and situation, CNII-XII grossly intact, no focal deficits  Psych: Normal mood and affect   ASSESSMENT:   Julie Benjamin is a 49 y.o. female who presents for the following: No diagnosis found.  PLAN:   There are no diagnoses linked to this encounter.  Disposition: No follow-ups on file.  Medication Adjustments/Labs and Tests Ordered: Current medicines are reviewed at length with  the patient today.  Concerns regarding medicines are outlined above.  No orders of the defined types were placed in this encounter.  No orders of the defined types were placed in this encounter.   There are no Patient Instructions on file for this visit.   Time Spent with Patient: I have spent a total of *** minutes with patient reviewing hospital notes, telemetry, EKGs, labs and examining the patient as well as establishing an assessment and plan that was discussed with the patient.  > 50% of time was spent in direct patient care.  Signed, Addison Naegeli. Audie Box, [facility]  [address] [place], [place] [address] [phone]  05/28/2019 8:27 PM

## 2019-05-29 ENCOUNTER — Ambulatory Visit: Payer: Self-pay | Admitting: Cardiovascular Disease

## 2019-06-23 ENCOUNTER — Telehealth (INDEPENDENT_AMBULATORY_CARE_PROVIDER_SITE_OTHER): Payer: Self-pay | Admitting: Primary Care

## 2019-06-23 ENCOUNTER — Encounter (INDEPENDENT_AMBULATORY_CARE_PROVIDER_SITE_OTHER): Payer: Self-pay | Admitting: Primary Care

## 2019-06-23 ENCOUNTER — Other Ambulatory Visit (INDEPENDENT_AMBULATORY_CARE_PROVIDER_SITE_OTHER): Payer: Self-pay | Admitting: Primary Care

## 2019-06-23 ENCOUNTER — Other Ambulatory Visit: Payer: Self-pay

## 2019-06-23 DIAGNOSIS — J45909 Unspecified asthma, uncomplicated: Secondary | ICD-10-CM

## 2019-06-23 DIAGNOSIS — E1165 Type 2 diabetes mellitus with hyperglycemia: Secondary | ICD-10-CM

## 2019-06-23 DIAGNOSIS — I5022 Chronic systolic (congestive) heart failure: Secondary | ICD-10-CM

## 2019-06-23 DIAGNOSIS — Z7689 Persons encountering health services in other specified circumstances: Secondary | ICD-10-CM

## 2019-06-23 DIAGNOSIS — I10 Essential (primary) hypertension: Secondary | ICD-10-CM

## 2019-06-23 MED ORDER — TRUE METRIX BLOOD GLUCOSE TEST VI STRP: ORAL_STRIP | 12 refills | Status: AC

## 2019-06-23 MED ORDER — ALBUTEROL SULFATE (2.5 MG/3ML) 0.083% IN NEBU: 2.5000 mg | INHALATION_SOLUTION | Freq: Four times a day (QID) | RESPIRATORY_TRACT | 12 refills | Status: DC | PRN

## 2019-06-23 MED ORDER — ALBUTEROL SULFATE HFA 108 (90 BASE) MCG/ACT IN AERS: 1.0000 | INHALATION_SPRAY | Freq: Four times a day (QID) | RESPIRATORY_TRACT | 12 refills | Status: DC | PRN

## 2019-06-23 MED ORDER — BUDESONIDE-FORMOTEROL FUMARATE 160-4.5 MCG/ACT IN AERO: 2.0000 | INHALATION_SPRAY | Freq: Two times a day (BID) | RESPIRATORY_TRACT | 12 refills | Status: AC

## 2019-06-23 MED ORDER — SITAGLIPTIN PHOSPHATE 100 MG PO TABS: 100.0000 mg | ORAL_TABLET | Freq: Every day | ORAL | 1 refills | Status: DC

## 2019-06-23 MED ORDER — MONTELUKAST SODIUM 10 MG PO TABS: 10.0000 mg | ORAL_TABLET | Freq: Every day | ORAL | 12 refills | Status: DC

## 2019-06-23 NOTE — Progress Notes (Addendum)
Virtual Visit via Telephone Note  I connected with Julie Benjamin on 06/23/19 at  9:10 AM EDT by telephone and verified that I am speaking with the correct person using two identifiers.   I discussed the limitations, risks, security and privacy concerns of performing an evaluation and management service by telephone and the availability of in person appointments. I also discussed with the patient that there may be a patient responsible charge related to this service. The patient expressed understanding and agreed to proceed. Patient location unknown Julie Mire, NP was at [facility]   Fasting 120-146 History of Present Illness: Julie Benjamin is a 49 year old female having a tele visit to establish care and requesting medication refills. She is a type 2 diabetes and fasting blood sugars are 120-146. Chronic systolic heart failure followed by cardiology and HTN.  Past Medical History:  Diagnosis Date  . Acute respiratory failure with hypoxia ([place]) 11/01/2018  . Asthma   . Diabetes mellitus without complication ([place])   . Hypertension   . Respiratory failure with hypoxia ([place]) 11/01/2018   Current Outpatient Medications on File Prior to Visit  Medication Sig Dispense Refill  . albuterol (PROVENTIL) (2.5 MG/3ML) 0.083% nebulizer solution Take 3 mLs (2.5 mg total) by nebulization every 6 (six) hours as needed for wheezing or shortness of breath. 75 mL 12  . albuterol (VENTOLIN HFA) 108 (90 Base) MCG/ACT inhaler Inhale 1-2 puffs into the lungs every 6 (six) hours as needed for wheezing or shortness of breath. (Patient not taking: Reported on 06/23/2019) 18 g 12  . Blood Glucose Monitoring Suppl (TRUE METRIX METER) w/Device KIT Use to measure blood sugar twice a day (Patient not taking: Reported on 06/23/2019) 1 kit 0  . budesonide-formoterol (SYMBICORT) 160-4.5 MCG/ACT inhaler Inhale 2 puffs into the lungs 2 (two) times daily. 1 Inhaler 12  . carvedilol (COREG) 25 MG tablet  Take 1 tablet (25 mg total) by mouth 2 (two) times daily with a meal. (Patient not taking: Reported on 06/23/2019) 180 tablet 3  . glucose blood (TRUE METRIX BLOOD GLUCOSE TEST) test strip Use as instructed (Patient not taking: Reported on 06/23/2019) 100 each 12  . ipratropium-albuterol (DUONEB) 0.5-2.5 (3) MG/3ML SOLN Take 3 mLs by nebulization every 6 (six) hours as needed (for breathing). (Patient not taking: Reported on 06/23/2019) 360 mL 12  . montelukast (SINGULAIR) 10 MG tablet Take 1 tablet (10 mg total) by mouth at bedtime. (Patient not taking: Reported on 06/23/2019) 30 tablet 12  . sitaGLIPtin (JANUVIA) 100 MG tablet Take 1 tablet (100 mg total) by mouth daily. (Patient not taking: Reported on 06/23/2019) 30 tablet 4  . spironolactone (ALDACTONE) 25 MG tablet Take 1 tablet (25 mg total) by mouth daily. (Patient not taking: Reported on 06/23/2019) 90 tablet 3  . TRUEplus Lancets 28G MISC Use to measure blood sugar twice a day (Patient not taking: Reported on 06/23/2019) 100 each 1   No current facility-administered medications on file prior to visit.   Observations/Objective: Review of Systems  Respiratory: Positive for cough and shortness of breath.   Cardiovascular: Positive for leg swelling.  Genitourinary: Positive for frequency.  All other systems reviewed and are negative.   Assessment and Plan: Julie Benjamin was seen today for new patient (initial visit) and medication refill.  Diagnoses and all orders for this visit: Julie Benjamin was seen today for new patient (initial visit) and medication refill.  Diagnoses and all orders for this visit:  Essential hypertension Counseled on blood pressure  goal of less than 130/80, low-sodium, DASH diet, medication compliance, 150 minutes of moderate intensity exercise per week.  Type 2 diabetes mellitus with hyperglycemia, without long-term current use of insulin (HCC) ADA recommends the following therapeutic goals for glycemic control related to A1c  measurements: Goal of therapy: Less than 6.5 hemoglobin A1c.  Decrease foods that are high in carbohydrates are the following rice, potatoes, breads, sugars, and pastas.  Reduction in the intake (eating) will assist in lowering your blood sugars.    sitaGLIPtin (JANUVIA) 100 MG tablet; Take 1 tablet (100 mg total) by mouth daily. -     glucose blood (TRUE METRIX BLOOD GLUCOSE TEST) test strip; Use as instructed -     CBC with Differential/Platelet; Future -     Lipid panel; Future -     CMP14+EGFR; Future -     Hemoglobin A1c; Future  Asthma, unspecified asthma severity, unspecified whether complicated, unspecified whether persistent Presentation of symptoms which include cough, wheeze, and shortness of breath    montelukast (SINGULAIR) 10 MG tablet; Take 1 tablet (10 mg total) by mouth at bedtime. -     budesonide-formoterol (SYMBICORT) 160-4.5 MCG/ACT inhaler; Inhale 2 puffs into the lungs 2 (two) times daily. -     albuterol (VENTOLIN HFA) 108 (90 Base) MCG/ACT inhaler; Inhale 1-2 puffs into the lungs every 6 (six) hours as needed for wheezing or shortness of breath. -     albuterol (PROVENTIL) (2.5 MG/3ML) 0.083% nebulizer solution; Take 3 mLs (2.5 mg total) by nebulization every 6 (six) hours as needed for wheezing or shortness of breath.  Chronic systolic heart failure ([place]) Followed by cardiologist requested lasix and spirolactone asked patient to request from cardiologist   Encounter to establish care Julie Mire, NP-C will be your  (PCP) she is mastered prepared . She is skilled to diagnosed and treat illness. Also able to answer health concern as well as continuing care of varied medical conditions, not limited by cause, organ system, or diagnosis.   Follow Up Instructions:    I discussed the assessment and treatment plan with the patient. The patient was provided an opportunity to ask questions and all were answered. The patient agreed with the plan and demonstrated an  understanding of the instructions.   The patient was advised to call back or seek an in-person evaluation if the symptoms worsen or if the condition fails to improve as anticipated.  I provided 22 minutes of non-face-to-face time during this encounter.   Kerin Perna, NP

## 2019-06-24 ENCOUNTER — Other Ambulatory Visit: Payer: Self-pay | Admitting: Critical Care Medicine

## 2019-06-24 MED FILL — SYMBICORT 160-4.5 MCG INH: 160-4.5 | 30 days supply | Qty: 10 | Fill #4

## 2019-06-24 MED FILL — MONTELUKAST SOD 10 MG TAB: 10 | 30 days supply | Qty: 30 | Fill #4

## 2019-06-24 MED FILL — ?CARVEDILOL 25 MG TABLET: 25 | 30 days supply | Qty: 60 | Fill #0

## 2019-06-24 MED FILL — ?SPIRONOLACTONE 25 MG TABLE: 25 | 30 days supply | Qty: 30 | Fill #0

## 2019-06-24 MED FILL — $PROVENTIL HFA 90 MCG INHAL: 108 (90 BAS | 75 days supply | Qty: 3 | Fill #0

## 2019-06-24 MED FILL — $JANUVIA 100 MG TABLET: 100 | 60 days supply | Qty: 60 | Fill #0

## 2019-06-24 MED FILL — ALBUTEROL SUL 2.5 MG/3 ML S: (2.5 MG/3ML | 6 days supply | Qty: 75 | Fill #0

## 2019-06-25 ENCOUNTER — Ambulatory Visit: Payer: Medicaid Other | Admitting: Family Medicine

## 2019-07-15 ENCOUNTER — Other Ambulatory Visit: Payer: Self-pay

## 2019-07-15 ENCOUNTER — Ambulatory Visit: Payer: Medicaid Other | Admitting: Pulmonary Disease

## 2019-07-15 ENCOUNTER — Encounter: Payer: Self-pay | Admitting: Pulmonary Disease

## 2019-07-15 VITALS — BP 162/98 | HR 88 | Temp 98.9°F | Ht 63.0 in | Wt 302.6 lb

## 2019-07-15 DIAGNOSIS — G4733 Obstructive sleep apnea (adult) (pediatric): Secondary | ICD-10-CM

## 2019-07-15 DIAGNOSIS — J4550 Severe persistent asthma, uncomplicated: Secondary | ICD-10-CM

## 2019-07-15 DIAGNOSIS — R053 Chronic cough: Secondary | ICD-10-CM

## 2019-07-15 DIAGNOSIS — E662 Morbid (severe) obesity with alveolar hypoventilation: Secondary | ICD-10-CM

## 2019-07-15 DIAGNOSIS — R05 Cough: Secondary | ICD-10-CM

## 2019-07-15 LAB — CBC WITH DIFFERENTIAL/PLATELET
Basophils Absolute: 0.1 10*3/uL (ref 0.0–0.1)
Basophils Relative: 0.7 % (ref 0.0–3.0)
Eosinophils Absolute: 0 10*3/uL (ref 0.0–0.7)
Eosinophils Relative: 0.4 % (ref 0.0–5.0)
HCT: 40.6 % (ref 36.0–46.0)
Hemoglobin: 13.5 g/dL (ref 12.0–15.0)
Lymphocytes Relative: 13.9 % (ref 12.0–46.0)
Lymphs Abs: 1.5 10*3/uL (ref 0.7–4.0)
MCHC: 33.4 g/dL (ref 30.0–36.0)
MCV: 94.5 fl (ref 78.0–100.0)
Monocytes Absolute: 0.9 10*3/uL (ref 0.1–1.0)
Monocytes Relative: 8 % (ref 3.0–12.0)
Neutro Abs: 8.6 10*3/uL — ABNORMAL HIGH (ref 1.4–7.7)
Neutrophils Relative %: 77 % (ref 43.0–77.0)
Platelets: 320 10*3/uL (ref 150.0–400.0)
RBC: 4.29 Mil/uL (ref 3.87–5.11)
RDW: 13.9 % (ref 11.5–15.5)
WBC: 11.2 10*3/uL — ABNORMAL HIGH (ref 4.0–10.5)

## 2019-07-15 NOTE — Progress Notes (Signed)
Pulmonary, Critical Care, and Sleep Medicine  Chief Complaint  Patient presents with  . Follow-up    Wears Bipap-no complaints. Wears avg. 2-3 hrs. each night.Sob on humid days    Constitutional:  BP (!) 162/98 (BP Location: Left Arm, Cuff Size: Large)   Pulse 88   Temp 98.9 F (37.2 C) (Oral)   Ht 5' 3"  (1.6 m)   Wt (!) 302 lb 9.6 oz (137.3 kg)   SpO2 98%   BMI 53.60 kg/m   Past Medical History:  HTN, DM, non ischemic CM  Brief Summary:  Julie Benjamin is a 49 y.o. female with allergic asthma, OSA and OHS.  Subjective:  She has been using symbicort bid, singulair qhs, and albuterol/duoneb 2 to 4 times per day.  Has chronic sinus congestion with post nasal drip.  Has noticed more chest congestion.  Bringing up clear to tan sputum.  Gets intermittent episodes of wheezing, especially in humid weather.    Has Bipap.  Uses some at night and when she naps during the day.  She has full face mask and current mask seems most comfortable.  She is still getting used to wearing mask.   Physical Exam:   Appearance - well kempt   ENMT - no sinus tenderness, no oral exudate, no LAN, Mallampati 4 airway, no stridor  Respiratory - equal breath sounds bilaterally, no wheezing or rales  CV - s1s2 regular rate and rhythm, no murmurs  Ext - no clubbing, no edema  Skin - no rashes  Psych - normal mood and affect   Assessment/Plan:   Allergic asthma. - continue symbicort, singulair, prn albuterol/duoneb - chest PFT, CXR, CBC with diff, RAST with IgE - might be candidate for biologic agent  Obstructive sleep apnea with obesity hypoventilation syndrome. - original diagnosis in [place], [place] - continue auto Bipap with max IPAP 25, min EPAP 5, PS 4 cm H2O  Non ischemic CM. - advised her to f/u with cardiology  A total of  32 minutes spent addressing patient care issues on day of visit.   Follow up:   Patient Instructions  Chest xray and lab tests today  Will  schedule pulmonary function test  Follow up in 6 to 8 weeks with Dr. Halford Chessman or Nurse Practitioner  Signature:  Chesley Mires, MD [place] Pager: [phone] 07/15/2019, 11:35 AM  Flow Sheet     Pulmonary tests:    Sleep tests:    Cardiac tests:   Echo 11/02/18 >> EF 25 to 30%, mild LVH, grade 2 DD  RHC/LHC 11/05/18 >> no CAD; RV 46/6/13, PA 43/20/35, PCWP 21  Medications:   Allergies as of 07/15/2019      Reactions   Shellfish Allergy       Medication List       Accurate as of July 15, 2019 11:35 AM. If you have any questions, ask your nurse or doctor.        albuterol 108 (90 Base) MCG/ACT inhaler Commonly known as: VENTOLIN HFA Inhale 1-2 puffs into the lungs every 6 (six) hours as needed for wheezing or shortness of breath.   albuterol (2.5 MG/3ML) 0.083% nebulizer solution Commonly known as: PROVENTIL Take 3 mLs (2.5 mg total) by nebulization every 6 (six) hours as needed for wheezing or shortness of breath.   budesonide-formoterol 160-4.5 MCG/ACT inhaler Commonly known as: SYMBICORT Inhale 2 puffs into the lungs 2 (two) times daily.   carvedilol 25 MG tablet Commonly known as: COREG Take 1  tablet (25 mg total) by mouth 2 (two) times daily with a meal.   ENTRESTO PO Take by mouth in the morning and at bedtime.   ipratropium-albuterol 0.5-2.5 (3) MG/3ML Soln Commonly known as: DUONEB Take 3 mLs by nebulization every 6 (six) hours as needed (for breathing).   montelukast 10 MG tablet Commonly known as: SINGULAIR Take 1 tablet (10 mg total) by mouth at bedtime.   sitaGLIPtin 100 MG tablet Commonly known as: Januvia Take 1 tablet (100 mg total) by mouth daily.   spironolactone 25 MG tablet Commonly known as: ALDACTONE Take 1 tablet (25 mg total) by mouth daily.   True Metrix Blood Glucose Test test strip Generic drug: glucose blood Use as instructed   True Metrix Meter w/Device Kit Use to measure blood sugar twice a  day   TRUEplus Lancets 28G Misc Use to measure blood sugar twice a day       Past Surgical History:  She  has a past surgical history that includes Cesarean section; Tubal ligation (2001); and RIGHT/LEFT HEART CATH AND CORONARY ANGIOGRAPHY (N/A, 11/05/2018).  Family History:  Her family history includes COPD in her mother; Hypertension in her mother; Kidney failure in her sister.  Social History:  She  reports that she has never smoked. She has never used smokeless tobacco.

## 2019-07-15 NOTE — Patient Instructions (Signed)
Chest xray and lab tests today  Will schedule pulmonary function test  Follow up in 6 to 8 weeks with Dr. Craige Cotta or Nurse Practitioner

## 2019-07-15 NOTE — Addendum Note (Signed)
Addended by: Demetrio Lapping E on: 07/15/2019 11:37 AM   Modules accepted: Orders

## 2019-07-19 LAB — ALLERGENS W/TOTAL IGE AREA 2
Alternaria Alternata IgE: <0.1 kU/L
Aspergillus Fumigatus IgE: <0.1 kU/L
Bermuda Grass IgE: <0.1 kU/L
Cat Dander IgE: 0.44 kU/L — AB
Cedar, Mountain IgE: 0.28 kU/L — AB
Cladosporium Herbarum IgE: <0.1 kU/L
Cockroach, German IgE: 2.36 kU/L — AB
Common Silver Birch IgE: <0.1 kU/L
Cottonwood IgE: <0.1 kU/L
D Farinae IgE: 3.32 kU/L — AB
D Pteronyssinus IgE: 2.24 kU/L — AB
Dog Dander IgE: 0.21 kU/L — AB
Elm, American IgE: <0.1 kU/L
IgE (Immunoglobulin E), Serum: 336 IU/mL (ref 6–495)
Johnson Grass IgE: <0.1 kU/L
Maple/Box Elder IgE: <0.1 kU/L
Mouse Urine IgE: 7.34 kU/L — AB
Oak, White IgE: <0.1 kU/L
Pecan, Hickory IgE: <0.1 kU/L
Penicillium Chrysogen IgE: <0.1 kU/L
Pigweed, Rough IgE: <0.1 kU/L
Ragweed, Short IgE: 0.12 kU/L — AB
Sheep Sorrel IgE Qn: <0.1 kU/L
Timothy Grass IgE: <0.1 kU/L
White Mulberry IgE: <0.1 kU/L

## 2019-07-21 ENCOUNTER — Ambulatory Visit: Payer: Self-pay | Admitting: *Deleted

## 2019-07-21 NOTE — Telephone Encounter (Signed)
Pt called in c/o nausea for the past 2 weeks.   No new medications or exposures to anyone sick.   No vomiting or diarrhea.    Poor appetite but she is eating and drinking because she is [place] not because she has an appetite.  While scheduling her an appt she mentioned she does not have a ride today.   The only appt available is with Dr. Shan Levans today at 1:30 other wise it's into August before there are any more openings.   She is going to check and see if someone can bring her today to the 1:30 appt with Dr. Delford Field and call us back.   I let her know that would be fine and that the appt may not be available when she calls back.  She verbalized understanding and will call us back.    Reason for Disposition . Nausea lasts > 1 week    Nausea for 2 weeks  Answer Assessment - Initial Assessment Questions 1. NAUSEA SEVERITY: "How bad is the nausea?" (e.g., mild, moderate, severe; dehydration, weight loss)   - MILD: loss of appetite without change in eating habits   - MODERATE: decreased oral intake without significant weight loss, dehydration, or malnutrition   - SEVERE: inadequate caloric or fluid intake, significant weight loss, symptoms of dehydration     I've been nauseas for 2 weeks.   I don't have much appetite.  I think it's stress. 2. ONSET: "When did the nausea begin?"     2 weeks ago.    I'm new to the area.   I'm having financial problems.  All my family is in [place]. 3. VOMITING: "Any vomiting?" If Yes, ask: "How many times today?"     No vomiting.   I'm eating and drinking but I just don't have an appetite.    I had an abscess on one of my back teeth a month ago.   I've not had it fixed yet.   I'm waiting for my Medicaid to come through. 4. RECURRENT SYMPTOM: "Have you had nausea before?" If Yes, ask: "When was the last time?" "What happened that time?"     No 5. CAUSE: "What do you think is causing the nausea?"     Stress maybe.   Maybe one of my medications but I've not  started anything new recently.   No diarrhea.  I had acid reflux real bad 6 years ago but not now. 6. PREGNANCY: "Is there any chance you are pregnant?" (e.g., unprotected intercourse, missed birth control pill, broken condom)     Not asked  Protocols used: NAUSEA-A-AH

## 2019-07-24 ENCOUNTER — Telehealth: Payer: Self-pay | Admitting: Pulmonary Disease

## 2019-07-24 NOTE — Telephone Encounter (Signed)
Patient called and informed that provider has not had a chance to review results from recent lab work. We will contact her as soon as results are available.

## 2019-08-05 MED FILL — ?SPIRONOLACTONE 25 MG TABLE: 25 | 30 days supply | Qty: 30 | Fill #1

## 2019-08-05 MED FILL — ALBUTEROL SUL 2.5 MG/3 ML S: (2.5 MG/3ML | 6 days supply | Qty: 75 | Fill #1

## 2019-08-05 MED FILL — SYMBICORT 160-4.5 MCG INH: 160-4.5 | 30 days supply | Qty: 10 | Fill #5

## 2019-08-05 MED FILL — ?CARVEDILOL 25 MG TABLET: 25 | 30 days supply | Qty: 60 | Fill #1

## 2019-08-05 MED FILL — MONTELUKAST SOD 10 MG TAB: 10 | 30 days supply | Qty: 30 | Fill #5

## 2019-08-22 ENCOUNTER — Inpatient Hospital Stay (HOSPITAL_COMMUNITY): Admission: RE | Admit: 2019-08-22 | Payer: Medicaid Other | Source: Ambulatory Visit

## 2019-08-26 ENCOUNTER — Ambulatory Visit: Payer: Medicaid Other | Admitting: Adult Health

## 2019-09-10 MED FILL — ?CARVEDILOL 25 MG TABLET: 25 | 30 days supply | Qty: 60 | Fill #2

## 2019-09-10 MED FILL — ?SPIRONOLACTONE 25 MG TABLE: 25 | 30 days supply | Qty: 30 | Fill #2

## 2019-09-10 MED FILL — $JANUVIA 100 MG TABLET: 100 | 90 days supply | Qty: 90 | Fill #1

## 2019-09-10 MED FILL — MONTELUKAST SOD 10 MG TAB: 10 | 30 days supply | Qty: 30 | Fill #6

## 2019-09-10 MED FILL — ALBUTEROL SUL 2.5 MG/3 ML S: (2.5 MG/3ML | 6 days supply | Qty: 75 | Fill #2

## 2019-09-14 MED FILL — IPRAT-ALBUT 0.5-3(2.5) MG/3: 0.5-2.5 (3) | 15 days supply | Qty: 180 | Fill #2

## 2019-09-14 MED FILL — SYMBICORT 160-4.5 MCG INH: 160-4.5 | 30 days supply | Qty: 10 | Fill #6

## 2019-10-13 ENCOUNTER — Other Ambulatory Visit (HOSPITAL_COMMUNITY): Payer: Medicaid Other

## 2019-10-16 ENCOUNTER — Ambulatory Visit: Payer: Medicaid Other | Admitting: Adult Health

## 2019-10-18 NOTE — Progress Notes (Deleted)
Cardiology Office Note:   Date:  10/18/2019  NAME:  Julie Benjamin    MRN: [id number] DOB:  1970/02/18   PCP:  Patient, No Pcp Per  Cardiologist:  Reatha Harps, MD  Electrophysiologist:  None   Referring MD: No ref. provider found   No chief complaint on file. ***  History of Present Illness:   Julie Benjamin is a 49 y.o. female with a hx of systolic HF, DM, HTN who presents for follow-up. Needs repeat echo.   Problem List 1. Systolic HF EF 25-30% 11/2018 -Normal left heart cath (11/05/2018) 2. Diabetes -A1c 8.5 -T chol 132, HDL 43, LDL 70, TG 97 3. Hypertension  4. Obesity  Past Medical History: Past Medical History:  Diagnosis Date   Acute respiratory failure with hypoxia (HCC) 11/01/2018   Asthma    Diabetes mellitus without complication (HCC)    Hypertension    Respiratory failure with hypoxia (HCC) 11/01/2018    Past Surgical History: Past Surgical History:  Procedure Laterality Date   CESAREAN SECTION     RIGHT/LEFT HEART CATH AND CORONARY ANGIOGRAPHY N/A 11/05/2018   Procedure: RIGHT/LEFT HEART CATH AND CORONARY ANGIOGRAPHY;  Surgeon: Kathleene Hazel, MD;  Location: [facility];  Service: Cardiovascular;  Laterality: N/A;   TUBAL LIGATION  2001    Current Medications: No outpatient medications have been marked as taking for the 10/20/19 encounter (Appointment) with O'Neal, Ronnald Ramp, MD.     Allergies:    Shellfish allergy   Social History: Social History   Socioeconomic History   Marital status: Single    Spouse name: Not on file   Number of children: Not on file   Years of education: Not on file   Highest education level: Not on file  Occupational History   Not on file  Tobacco Use   Smoking status: Never Smoker   Smokeless tobacco: Never Used  Substance and Sexual Activity   Alcohol use: Not on file   Drug use: Not on file   Sexual activity: Not on file  Other Topics Concern   Not on file  Social  History Narrative   Not on file   Social Determinants of Health   Financial Resource Strain:    Difficulty of Paying Living Expenses: Not on file  Food Insecurity:    Worried About Running Out of Food in the Last Year: Not on file   Ran Out of Food in the Last Year: Not on file  Transportation Needs:    Lack of Transportation (Medical): Not on file   Lack of Transportation (Non-Medical): Not on file  Physical Activity:    Days of Exercise per Week: Not on file   Minutes of Exercise per Session: Not on file  Stress:    Feeling of Stress : Not on file  Social Connections:    Frequency of Communication with Friends and Family: Not on file   Frequency of Social Gatherings with Friends and Family: Not on file   Attends Religious Services: Not on file   Active Member of Clubs or Organizations: Not on file   Attends Banker Meetings: Not on file   Marital Status: Not on file     Family History: The patient's ***family history includes COPD in her mother; Hypertension in her mother; Kidney failure in her sister.  ROS:   All other ROS reviewed and negative. Pertinent positives noted in the HPI.     EKGs/Labs/Other Studies Reviewed:   The following studies  were personally reviewed by me today:  EKG:  EKG is *** ordered today.  The ekg ordered today demonstrates ***, and was personally reviewed by me.   Recent Labs: 10/31/2018: B Natriuretic Peptide 931.6 11/05/2018: Magnesium 1.8; TSH 0.723 11/18/2018: ALT 20 12/15/2018: BUN 29; Creatinine, Ser 1.44; Potassium 4.5; Sodium 144 07/15/2019: Hemoglobin 13.5; Platelets 320.0   Recent Lipid Panel    Component Value Date/Time   CHOL 132 11/05/2018 0312   TRIG 97 11/05/2018 0312   HDL 43 11/05/2018 0312   CHOLHDL 3.1 11/05/2018 0312   VLDL 19 11/05/2018 0312   LDLCALC 70 11/05/2018 0312    Physical Exam:   VS:  There were no vitals taken for this visit.   Wt Readings from Last 3 Encounters:  07/15/19  (!) 302 lb 9.6 oz (137.3 kg)  12/22/18 288 lb (130.6 kg)  12/15/18 288 lb (130.6 kg)    General: Well nourished, well developed, in no acute distress Heart: Atraumatic, normal size  Eyes: PEERLA, EOMI  Neck: Supple, no JVD Endocrine: No thryomegaly Cardiac: Normal S1, S2; RRR; no murmurs, rubs, or gallops Lungs: Clear to auscultation bilaterally, no wheezing, rhonchi or rales  Abd: Soft, nontender, no hepatomegaly  Ext: No edema, pulses 2+ Musculoskeletal: No deformities, BUE and BLE strength normal and equal Skin: Warm and dry, no rashes   Neuro: Alert and oriented to person, place, time, and situation, CNII-XII grossly intact, no focal deficits  Psych: Normal mood and affect   ASSESSMENT:   Julie Benjamin is a 49 y.o. female who presents for the following: No diagnosis found.  PLAN:   There are no diagnoses linked to this encounter.  Disposition: No follow-ups on file.  Medication Adjustments/Labs and Tests Ordered: Current medicines are reviewed at length with the patient today.  Concerns regarding medicines are outlined above.  No orders of the defined types were placed in this encounter.  No orders of the defined types were placed in this encounter.   There are no Patient Instructions on file for this visit.   Time Spent with Patient: I have spent a total of *** minutes with patient reviewing hospital notes, telemetry, EKGs, labs and examining the patient as well as establishing an assessment and plan that was discussed with the patient.  > 50% of time was spent in direct patient care.  Signed, Lenna Gilford. Flora Lipps, MD [facility]  [address], [address] [place], [place] [address] [phone]  10/18/2019 8:55 PM

## 2019-10-20 ENCOUNTER — Ambulatory Visit: Payer: Medicaid Other | Admitting: Cardiovascular Disease

## 2019-10-20 DIAGNOSIS — I10 Essential (primary) hypertension: Secondary | ICD-10-CM

## 2019-10-20 DIAGNOSIS — I5022 Chronic systolic (congestive) heart failure: Secondary | ICD-10-CM

## 2019-10-27 MED FILL — ?CARVEDILOL 25 MG TABLET: 25 | 30 days supply | Qty: 60 | Fill #3

## 2019-10-27 MED FILL — MONTELUKAST SOD 10 MG TAB: 10 | 30 days supply | Qty: 30 | Fill #7

## 2019-10-27 MED FILL — ?SPIRONOLACTONE 25 MG TABLE: 25 | 30 days supply | Qty: 30 | Fill #3

## 2019-10-27 MED FILL — !SYMBICORT 160-4.5 MCG INH: 160-4.5 | 30 days supply | Qty: 1 | Fill #7

## 2019-11-11 ENCOUNTER — Other Ambulatory Visit: Payer: Self-pay

## 2019-11-11 ENCOUNTER — Ambulatory Visit: Payer: Self-pay | Attending: Family Medicine | Admitting: Family Medicine

## 2019-11-11 ENCOUNTER — Encounter: Payer: Self-pay | Admitting: Family Medicine

## 2019-11-11 ENCOUNTER — Other Ambulatory Visit: Payer: Self-pay | Admitting: Family Medicine

## 2019-11-11 VITALS — BP 175/120 | HR 81 | Ht 63.0 in | Wt 321.0 lb

## 2019-11-11 DIAGNOSIS — I110 Hypertensive heart disease with heart failure: Secondary | ICD-10-CM

## 2019-11-11 DIAGNOSIS — N183 Chronic kidney disease, stage 3 unspecified: Secondary | ICD-10-CM | POA: Insufficient documentation

## 2019-11-11 DIAGNOSIS — J4540 Moderate persistent asthma, uncomplicated: Secondary | ICD-10-CM

## 2019-11-11 DIAGNOSIS — I130 Hypertensive heart and chronic kidney disease with heart failure and stage 1 through stage 4 chronic kidney disease, or unspecified chronic kidney disease: Secondary | ICD-10-CM | POA: Insufficient documentation

## 2019-11-11 DIAGNOSIS — I129 Hypertensive chronic kidney disease with stage 1 through stage 4 chronic kidney disease, or unspecified chronic kidney disease: Secondary | ICD-10-CM

## 2019-11-11 DIAGNOSIS — Z1159 Encounter for screening for other viral diseases: Secondary | ICD-10-CM

## 2019-11-11 DIAGNOSIS — Z7984 Long term (current) use of oral hypoglycemic drugs: Secondary | ICD-10-CM | POA: Insufficient documentation

## 2019-11-11 DIAGNOSIS — E1122 Type 2 diabetes mellitus with diabetic chronic kidney disease: Secondary | ICD-10-CM

## 2019-11-11 DIAGNOSIS — E1169 Type 2 diabetes mellitus with other specified complication: Secondary | ICD-10-CM

## 2019-11-11 DIAGNOSIS — G4489 Other headache syndrome: Secondary | ICD-10-CM

## 2019-11-11 DIAGNOSIS — I428 Other cardiomyopathies: Secondary | ICD-10-CM | POA: Insufficient documentation

## 2019-11-11 DIAGNOSIS — Z79899 Other long term (current) drug therapy: Secondary | ICD-10-CM | POA: Insufficient documentation

## 2019-11-11 DIAGNOSIS — I5022 Chronic systolic (congestive) heart failure: Secondary | ICD-10-CM | POA: Insufficient documentation

## 2019-11-11 DIAGNOSIS — Z8249 Family history of ischemic heart disease and other diseases of the circulatory system: Secondary | ICD-10-CM | POA: Insufficient documentation

## 2019-11-11 LAB — GLUCOSE, POCT (MANUAL RESULT ENTRY): POC Glucose: 92 mg/dl (ref 70–99)

## 2019-11-11 LAB — POCT GLYCOSYLATED HEMOGLOBIN (HGB A1C): HbA1c, POC (controlled diabetic range): 6.1 % (ref 0.0–7.0)

## 2019-11-11 MED ORDER — FUROSEMIDE 20 MG PO TABS: 20.0000 mg | ORAL_TABLET | Freq: Every day | ORAL | 0 refills | Status: DC

## 2019-11-11 MED ORDER — HYDRALAZINE HCL 50 MG PO TABS: 50.0000 mg | ORAL_TABLET | Freq: Two times a day (BID) | ORAL | 3 refills | Status: DC

## 2019-11-11 MED ORDER — FLUTICASONE PROPIONATE 50 MCG/ACT NA SUSP: 2.0000 | Freq: Every day | NASAL | 1 refills | Status: DC

## 2019-11-11 MED FILL — FLUTICASONE PROP 50 MCG SPR: 50 | 30 days supply | Qty: 16 | Fill #0

## 2019-11-11 MED FILL — FUROSEMIDE 20 MG TABS: 20 | 30 days supply | Qty: 30 | Fill #0

## 2019-11-11 MED FILL — hydrALAZINE HCL 50 MG TABS: 50 | 30 days supply | Qty: 60 | Fill #0

## 2019-11-11 NOTE — Patient Instructions (Signed)
Heart Failure, Self Care Heart failure is a serious condition. This sheet explains things you need to do to take care of yourself at home. To help you stay as healthy as possible, you may be asked to change your diet, take certain medicines, and make other changes in your life. Your doctor may also give you more specific instructions. If you have problems or questions, call your doctor. What are the risks? Having heart failure makes it more likely for you to have some problems. These problems can get worse if you do not take good care of yourself. Problems may include:  Blood clotting problems. This may cause a stroke.  Damage to the kidneys, liver, or lungs.  Abnormal heart rhythms. Supplies needed:  Scale for weighing yourself.  Blood pressure monitor.  Notebook.  Medicines. How to care for yourself when you have heart failure Medicines Take over-the-counter and prescription medicines only as told by your doctor. Take your medicines every day.  Do not stop taking your medicine unless your doctor tells you to do so.  Do not skip any medicines.  Get your prescriptions refilled before you run out of medicine. This is important. Eating and drinking   Eat heart-healthy foods. Talk with a diet specialist (dietitian) to create an eating plan.  Choose foods that: ? Have no trans fat. ? Are low in saturated fat and cholesterol.  Choose healthy foods, such as: ? Fresh or frozen fruits and vegetables. ? Fish. ? Low-fat (lean) meats. ? Legumes, such as beans, peas, and lentils. ? Fat-free or low-fat dairy products. ? Whole-grain foods. ? High-fiber foods.  Limit salt (sodium) if told by your doctor. Ask your diet specialist to tell you which seasonings are healthy for your heart.  Cook in healthy ways instead of frying. Healthy ways of cooking include roasting, grilling, broiling, baking, poaching, steaming, and stir-frying.  Limit how much fluid you drink, if told by your  doctor. Alcohol use  Do not drink alcohol if: ? Your doctor tells you not to drink. ? Your heart was damaged by alcohol, or you have very bad heart failure. ? You are pregnant, may be pregnant, or are planning to become pregnant.  If you drink alcohol: ? Limit how much you use to:  0-1 drink a day for women.  0-2 drinks a day for men. ? Be aware of how much alcohol is in your drink. In the [place] one drink equals one 12 oz bottle of beer (355 mL), one 5 oz glass of wine (148 mL), or one 1 oz glass of hard liquor (44 mL). Lifestyle   Do not use any products that contain nicotine or tobacco, such as cigarettes, e-cigarettes, and chewing tobacco. If you need help quitting, ask your doctor. ? Do not use nicotine gum or patches before talking to your doctor.  Do not use illegal drugs.  Lose weight if told by your doctor.  Do physical activity if told by your doctor. Talk to your doctor before you begin an exercise if: ? You are an older adult. ? You have very bad heart failure.  Learn to manage stress. If you need help, ask your doctor.  Get rehab (rehabilitation) to help you stay independent and to help with your quality of life.  Plan time to rest when you get tired. Check weight and blood pressure   Weigh yourself every day. This will help you to know if fluid is building up in your body. ? Weigh yourself every morning   after you pee (urinate) and before you eat breakfast. ? Wear the same amount of clothing each time. ? Write down your daily weight. Give your record to your doctor.  Check and write down your blood pressure as told by your doctor.  Check your pulse as told by your doctor. Dealing with very hot and very cold weather  If it is very hot: ? Avoid activities that take a lot of energy. ? Use air conditioning or fans, or find a cooler place. ? Avoid caffeine and alcohol. ? Wear clothing that is loose-fitting, lightweight, and light-colored.  If it is very  cold: ? Avoid activities that take a lot of energy. ? Layer your clothes. ? Wear mittens or gloves, a hat, and a scarf when you go outside. ? Avoid alcohol. Follow these instructions at home:  Stay up to date with shots (vaccines). Get pneumococcal and flu (influenza) shots.  Keep all follow-up visits as told by your doctor. This is important. Contact a doctor if:  You gain weight quickly.  You have increasing shortness of breath.  You cannot do your normal activities.  You get tired easily.  You cough a lot.  You don't feel like eating or feel like you may vomit (nauseous).  You become puffy (swell) in your hands, feet, ankles, or belly (abdomen).  You cannot sleep well because it is hard to breathe.  You feel like your heart is beating fast (palpitations).  You get dizzy when you stand up. Get help right away if:  You have trouble breathing.  You or someone else notices a change in your behavior, such as having trouble staying awake.  You have chest pain or discomfort.  You pass out (faint). These symptoms may be an emergency. Do not wait to see if the symptoms will go away. Get medical help right away. Call your local emergency services (911 in the [place] Do not drive yourself to the hospital. Summary  Heart failure is a serious condition. To care for yourself, you may have to change your diet, take medicines, and make other lifestyle changes.  Take your medicines every day. Do not stop taking them unless your doctor tells you to do so.  Eat heart-healthy foods, such as fresh or frozen fruits and vegetables, fish, lean meats, legumes, fat-free or low-fat dairy products, and whole-grain or high-fiber foods.  Ask your doctor if you can drink alcohol. You may have to stop alcohol use if you have very bad heart failure.  Contact your doctor if you gain weight quickly or feel that your heart is beating too fast. Get help right away if you pass out, or have chest pain  or trouble breathing. This information is not intended to replace advice given to you by your health care provider. Make sure you discuss any questions you have with your health care provider. Document Revised: 04/01/2018 Document Reviewed: 04/02/2018 Elsevier Patient Education  2020 Elsevier Inc.  

## 2019-11-11 NOTE — Progress Notes (Signed)
Subjective:  Patient ID: Julie Benjamin, female    DOB: Jan 14, 1970  Age: 49 y.o. MRN: [id number]  CC: Diabetes   HPI Julie Benjamin is a 49 year old female with history of asthma, obstructive sleep apnea, obesity hypoventilation syndrome, hypertension, heart failure with reduced EF (EF 25 to 30% from 11/2018) here for chronic disease management. She complains of asthma exacerbation, last seen by pulmonary in 07/15/2019 with recommendation to follow-up in 6 to 8 weeks with a pulmonary function test however she never followed through.  She attributes her lack of transportation to not keeping her appointment. She is dyspneic on exertion but denies presence of wheezing, chest pain and endorses compliance with Symbicort, Proventil and Singulair.  She complains of headaches which she wakes up with and after she takes her medications it improves a bit.  She denies presence of postnasal drip but sometimes has nasal congestion at night. Her  BP is elevated and she endorses compliance with her antihypertensive Her cardiology visit was last in 12/2018 and notes reveal plan to repeat echocardiogram in 3 to 6 months  to determine need for ICD.  She has not followed up with cardiology. She has not been taking her Lasix as she informs me Cardiology had said to discontinue it.  I have reviewed cardiology notes documented after her labs which state patient was advised to take Lasix every other day. She had gained 19 lbs since 4 months. She has a two-pillow orthopnea and some pedal edema.  For her diabetes she is on Januvia. Past Medical History:  Diagnosis Date  . Acute respiratory failure with hypoxia ([place]) 11/01/2018  . Asthma   . Diabetes mellitus without complication ([place])   . Hypertension   . Respiratory failure with hypoxia ([place]) 11/01/2018    Past Surgical History:  Procedure Laterality Date  . CESAREAN SECTION    . RIGHT/LEFT HEART CATH AND CORONARY ANGIOGRAPHY N/A 11/05/2018   Procedure:  RIGHT/LEFT HEART CATH AND CORONARY ANGIOGRAPHY;  Surgeon: Burnell Blanks, MD;  Location: [facility];  Service: Cardiovascular;  Laterality: N/A;  . TUBAL LIGATION  2001    Family History  Problem Relation Age of Onset  . Hypertension Mother   . COPD Mother   . Kidney failure Sister     Allergies  Allergen Reactions  . Shellfish Allergy     Outpatient Medications Prior to Visit  Medication Sig Dispense Refill  . albuterol (VENTOLIN HFA) 108 (90 Base) MCG/ACT inhaler Inhale 1-2 puffs into the lungs every 6 (six) hours as needed for wheezing or shortness of breath. 18 g 12  . Blood Glucose Monitoring Suppl (TRUE METRIX METER) w/Device KIT Use to measure blood sugar twice a day 1 kit 0  . carvedilol (COREG) 25 MG tablet Take 1 tablet (25 mg total) by mouth 2 (two) times daily with a meal. 180 tablet 3  . glucose blood (TRUE METRIX BLOOD GLUCOSE TEST) test strip Use as instructed 100 each 12  . ipratropium-albuterol (DUONEB) 0.5-2.5 (3) MG/3ML SOLN Take 3 mLs by nebulization every 6 (six) hours as needed (for breathing). 360 mL 12  . montelukast (SINGULAIR) 10 MG tablet Take 1 tablet (10 mg total) by mouth at bedtime. 30 tablet 12  . Sacubitril-Valsartan (ENTRESTO PO) Take by mouth in the morning and at bedtime.    . sitaGLIPtin (JANUVIA) 100 MG tablet Take 1 tablet (100 mg total) by mouth daily. 90 tablet 1  . spironolactone (ALDACTONE) 25 MG tablet Take 1 tablet (25 mg total)  by mouth daily. 90 tablet 3  . TRUEplus Lancets 28G MISC Use to measure blood sugar twice a day 100 each 1  . albuterol (PROVENTIL) (2.5 MG/3ML) 0.083% nebulizer solution Take 3 mLs (2.5 mg total) by nebulization every 6 (six) hours as needed for wheezing or shortness of breath. 75 mL 12  . budesonide-formoterol (SYMBICORT) 160-4.5 MCG/ACT inhaler Inhale 2 puffs into the lungs 2 (two) times daily. 1 Inhaler 12   No facility-administered medications prior to visit.     ROS Review of Systems    Constitutional: Positive for unexpected weight change. Negative for activity change, appetite change and fatigue.  HENT: Negative for congestion, sinus pressure and sore throat.   Eyes: Negative for visual disturbance.  Respiratory: Positive for shortness of breath. Negative for cough, chest tightness and wheezing.   Cardiovascular: Positive for leg swelling. Negative for chest pain and palpitations.  Gastrointestinal: Negative for abdominal distention, abdominal pain and constipation.  Endocrine: Negative for polydipsia.  Genitourinary: Negative for dysuria and frequency.  Musculoskeletal: Negative for arthralgias and back pain.  Skin: Negative for rash.  Neurological: Positive for headaches. Negative for tremors, light-headedness and numbness.  Hematological: Does not bruise/bleed easily.  Psychiatric/Behavioral: Negative for agitation and behavioral problems.    Objective:  BP (!) 175/120   Pulse 81   Ht 5' 3"  (1.6 m)   Wt (!) 321 lb (145.6 kg)   SpO2 100%   BMI 56.86 kg/m   BP/Weight 11/11/2019 07/15/2019 26/37/8588  Systolic BP 502 774 -  Diastolic BP 128 98 -  Wt. (Lbs) 321 302.6 288  BMI 56.86 53.6 49.44      Physical Exam Constitutional:      Appearance: She is well-developed. She is obese.  Neck:     Vascular: No JVD.  Cardiovascular:     Rate and Rhythm: Normal rate.     Heart sounds: Normal heart sounds. No murmur heard.   Pulmonary:     Effort: Pulmonary effort is normal.     Breath sounds: Normal breath sounds. No wheezing or rales.  Chest:     Chest wall: No tenderness.  Abdominal:     General: Bowel sounds are normal. There is no distension.     Palpations: Abdomen is soft. There is no mass.     Tenderness: There is no abdominal tenderness.  Musculoskeletal:        General: Normal range of motion.     Right lower leg: Edema (1+ non pitting ) present.     Left lower leg: Edema (1+ non pitting) present.  Neurological:     Mental Status: She is  alert and oriented to person, place, and time.  Psychiatric:        Mood and Affect: Mood normal.     CMP Latest Ref Rng & Units 12/15/2018 11/18/2018 11/07/2018  Glucose 65 - 99 mg/dL 116(H) 136(H) 116(H)  BUN 6 - 24 mg/dL 29(H) 23 31(H)  Creatinine 0.57 - 1.00 mg/dL 1.44(H) 1.44(H) 1.37(H)  Sodium 134 - 144 mmol/L 144 147(H) 142  Potassium 3.5 - 5.2 mmol/L 4.5 4.3 4.0  Chloride 96 - 106 mmol/L 102 102 101  CO2 20 - 29 mmol/L 30(H) 32(H) 32  Calcium 8.7 - 10.2 mg/dL 9.4 9.4 9.0  Total Protein 6.0 - 8.5 g/dL - 6.3 -  Total Bilirubin 0.0 - 1.2 mg/dL - 0.3 -  Alkaline Phos 39 - 117 IU/L - 88 -  AST 0 - 40 IU/L - 11 -  ALT 0 -  32 IU/L - 20 -    Lipid Panel     Component Value Date/Time   CHOL 132 11/05/2018 0312   TRIG 97 11/05/2018 0312   HDL 43 11/05/2018 0312   CHOLHDL 3.1 11/05/2018 0312   VLDL 19 11/05/2018 0312   LDLCALC 70 11/05/2018 0312    CBC    Component Value Date/Time   WBC 11.2 (H) 07/15/2019 1137   RBC 4.29 07/15/2019 1137   HGB 13.5 07/15/2019 1137   HGB 12.6 11/18/2018 1059   HCT 40.6 07/15/2019 1137   HCT 40.4 11/18/2018 1059   PLT 320.0 07/15/2019 1137   PLT 355 11/18/2018 1059   MCV 94.5 07/15/2019 1137   MCV 92 11/18/2018 1059   MCH 28.8 11/18/2018 1059   MCH 28.8 11/07/2018 0525   MCHC 33.4 07/15/2019 1137   RDW 13.9 07/15/2019 1137   RDW 14.1 11/18/2018 1059   LYMPHSABS 1.5 07/15/2019 1137   LYMPHSABS 1.1 11/18/2018 1059   MONOABS 0.9 07/15/2019 1137   EOSABS 0.0 07/15/2019 1137   EOSABS 0.0 11/18/2018 1059   BASOSABS 0.1 07/15/2019 1137   BASOSABS 0.1 11/18/2018 1059    Lab Results  Component Value Date   HGBA1C 6.1 11/11/2019    Assessment & Plan:  1. Type 2 diabetes mellitus with other specified complication, without long-term current use of insulin (HCC) Controlled with A1c of 6.1 She is currently on Januvia We will check renal function and might need to place her on renal dose of Januvia depending on labs Would love to  place on Farxiga however her renal function precludes this - POCT glucose (manual entry) - POCT glycosylated hemoglobin (Hb A1C) - CMP14+EGFR  2. Moderate persistent asthma, unspecified whether complicated Asthma seems to be stable Her dyspnea is related more to her nonischemic cardiomyopathy Continue Symbicort, Proventil, Singulair Advised to keep appointment with pulmonary for PFTs  3. Hypertensive heart disease with chronic systolic congestive heart failure ([place]) EF of 20 to 25% from echo of 12/2018 She may benefit from [place] however her renal function precludes initiating this I have restarted Lasix at a low dose Hydralazine added for optimization of blood pressure Advised to schedule appointment with her cardiologist - hydrALAZINE (APRESOLINE) 50 MG tablet; Take 1 tablet (50 mg total) by mouth in the morning and at bedtime.  Dispense: 60 tablet; Refill: 3 - Brain natriuretic peptide - furosemide (LASIX) 20 MG tablet; Take 1 tablet (20 mg total) by mouth daily.  Dispense: 30 tablet; Refill: 0  4. Hypertension in stage 3 chronic kidney disease due to type 2 diabetes mellitus (HCC) Combination of diabetic and hypertensive nephropathy Hydralazine has been added to optimize her blood pressure We will check renal function and consider referral to nephrology depending on labs  5. Other headache syndrome Likely sinus related Could also be due to elevated blood pressure We will treat with intranasal steroids - fluticasone (FLONASE) 50 MCG/ACT nasal spray; Place 2 sprays into both nostrils daily.  Dispense: 16 g; Refill: 1  6. Need for hepatitis C screening test - HCV RNA quant rflx ultra or genotyp([facility])    Meds ordered this encounter  Medications  . fluticasone (FLONASE) 50 MCG/ACT nasal spray    Sig: Place 2 sprays into both nostrils daily.    Dispense:  16 g    Refill:  1  . hydrALAZINE (APRESOLINE) 50 MG tablet    Sig: Take 1 tablet (50 mg total) by mouth in  the morning and at bedtime.  Dispense:  60 tablet    Refill:  3  . furosemide (LASIX) 20 MG tablet    Sig: Take 1 tablet (20 mg total) by mouth daily.    Dispense:  30 tablet    Refill:  0    Follow-up: Return in about 1 month (around 12/11/2019) for Hypertension.       Charlott Rakes, MD, FAAFP. [facility] [place], [place]   11/11/2019, 4:44 PM

## 2019-11-11 NOTE — Progress Notes (Signed)
Has been having headaches for the last 4 days.  Having trouble with asthma.

## 2019-11-13 ENCOUNTER — Telehealth: Payer: Self-pay

## 2019-11-13 LAB — CMP14+EGFR
ALT: 11 IU/L (ref 0–32)
AST: 9 IU/L (ref 0–40)
Albumin/Globulin Ratio: 1.3 (ref 1.2–2.2)
Albumin: 4 g/dL (ref 3.8–4.8)
Alkaline Phosphatase: 81 IU/L (ref 44–121)
BUN/Creatinine Ratio: 18 (ref 9–23)
BUN: 27 mg/dL — ABNORMAL HIGH (ref 6–24)
Bilirubin Total: <0.2 mg/dL (ref 0.0–1.2)
CO2: 24 mmol/L (ref 20–29)
Calcium: 9.2 mg/dL (ref 8.7–10.2)
Chloride: 105 mmol/L (ref 96–106)
Creatinine, Ser: 1.51 mg/dL — ABNORMAL HIGH (ref 0.57–1.00)
GFR calc Af Amer: 47 mL/min/{1.73_m2} — ABNORMAL LOW (ref >59)
GFR calc non Af Amer: 41 mL/min/{1.73_m2} — ABNORMAL LOW (ref >59)
Globulin, Total: 3.1 g/dL (ref 1.5–4.5)
Glucose: 87 mg/dL (ref 65–99)
Potassium: 4.5 mmol/L (ref 3.5–5.2)
Sodium: 144 mmol/L (ref 134–144)
Total Protein: 7.1 g/dL (ref 6.0–8.5)

## 2019-11-13 LAB — HCV RNA QUANT RFLX ULTRA OR GENOTYP: HCV Quant Baseline: NOT DETECTED IU/mL

## 2019-11-13 LAB — BRAIN NATRIURETIC PEPTIDE: BNP: 36.6 pg/mL (ref 0.0–100.0)

## 2019-11-13 NOTE — Telephone Encounter (Signed)
Patient name and DOB has been verified Patient was informed of lab results. Patient had no questions.  

## 2019-11-13 NOTE — Telephone Encounter (Signed)
-----   Message from Hoy Register, MD sent at 11/13/2019  9:27 AM EST ----- Labs reveal kidney function has worsened slightly compared to previous labs.  Uncontrolled hypertension can also worsen kidney function.  Lab test for evaluation of fluid retention is normal.  Please advised to continue with her current medications and follow-up with cardiology

## 2019-12-15 ENCOUNTER — Ambulatory Visit: Payer: Medicaid Other | Admitting: Family Medicine

## 2019-12-21 ENCOUNTER — Other Ambulatory Visit: Payer: Self-pay | Admitting: Cardiovascular Disease

## 2019-12-21 MED FILL — MONTELUKAST SOD 10 MG TAB: 10 | 30 days supply | Qty: 30 | Fill #0

## 2019-12-21 MED FILL — hydrALAZINE HCL 50 MG TABS: 50 | 30 days supply | Qty: 60 | Fill #1

## 2019-12-22 ENCOUNTER — Other Ambulatory Visit: Payer: Self-pay | Admitting: Cardiovascular Disease

## 2019-12-22 MED FILL — ALBUTEROL 0.083% INHAL SOLN: (2.5 MG/3ML | 6 days supply | Qty: 75 | Fill #3

## 2019-12-22 MED FILL — CARVEDILOL 25 MG TABLET: 25 | 30 days supply | Qty: 60 | Fill #0

## 2019-12-22 MED FILL — SYMBICORT 160-4.5 MCG INH: 160-4.5 | 30 days supply | Qty: 10 | Fill #0

## 2019-12-22 MED FILL — SPIRONOLACTONE 25 MG TABLET: 25 | 30 days supply | Qty: 30 | Fill #0

## 2019-12-23 ENCOUNTER — Telehealth: Payer: Self-pay | Admitting: Family Medicine

## 2019-12-23 NOTE — Telephone Encounter (Signed)
Medication Refill - Medication: Duoneb  Has the patient contacted their pharmacy? Yes.   (Agent: If no, request that the patient contact the pharmacy for the refill.) (Agent: If yes, when and what did the pharmacy advise?)  Preferred Pharmacy (with phone number or street name): [facility] - [place], [place] - [address]  Agent: Please be advised that RX refills may take up to 3 business days. We ask that you follow-up with your pharmacy.

## 2019-12-24 ENCOUNTER — Other Ambulatory Visit: Payer: Self-pay

## 2019-12-24 ENCOUNTER — Other Ambulatory Visit: Payer: Self-pay | Admitting: Family Medicine

## 2019-12-24 DIAGNOSIS — J45909 Unspecified asthma, uncomplicated: Secondary | ICD-10-CM

## 2019-12-24 MED ORDER — IPRATROPIUM-ALBUTEROL 0.5-2.5 (3) MG/3ML IN SOLN: 3.0000 mL | Freq: Four times a day (QID) | RESPIRATORY_TRACT | 2 refills | Status: DC | PRN

## 2019-12-24 MED ORDER — ALBUTEROL SULFATE (2.5 MG/3ML) 0.083% IN NEBU: 2.5000 mg | INHALATION_SOLUTION | Freq: Four times a day (QID) | RESPIRATORY_TRACT | 12 refills | Status: DC | PRN

## 2019-12-24 MED FILL — IPRAT-ALBUT 0.5-3(2.5) MG/3: 0.5-2.5 (3) | 15 days supply | Qty: 180 | Fill #0

## 2019-12-24 NOTE — Telephone Encounter (Signed)
Refill has been sent to [facility] pharmacy.

## 2019-12-24 NOTE — Telephone Encounter (Signed)
Duoneb sent

## 2019-12-24 NOTE — Telephone Encounter (Signed)
Patient called and said that she needed Duoneb, not just albuterol. She says that she has albuterol, but not Duoneb, She can be reached at [phone]. Please advise

## 2020-01-12 MED FILL — hydrALAZINE HCL 50 MG TABS: 50 | 30 days supply | Qty: 60 | Fill #1

## 2020-01-12 MED FILL — MONTELUKAST SOD 10 MG TAB: 10 | 30 days supply | Qty: 30 | Fill #0

## 2020-01-12 MED FILL — IPRAT-ALBUT 0.5-3(2.5) MG/3: 0.5-2.5 (3) | 15 days supply | Qty: 180 | Fill #0

## 2020-01-12 MED FILL — SPIRONOLACTONE 25 MG TABLET: 25 | 30 days supply | Qty: 30 | Fill #0

## 2020-01-12 MED FILL — CARVEDILOL 25 MG TABLET: 25 | 30 days supply | Qty: 60 | Fill #0

## 2020-01-12 MED FILL — ALBUTEROL 0.083% INHAL SOLN: (2.5 MG/3ML | 6 days supply | Qty: 75 | Fill #0

## 2020-01-12 MED FILL — SYMBICORT 160-4.5 MCG INH: 160-4.5 | 30 days supply | Qty: 10 | Fill #0

## 2020-01-25 ENCOUNTER — Telehealth: Payer: Self-pay

## 2020-01-25 ENCOUNTER — Ambulatory Visit: Payer: Medicaid Other | Attending: Nurse Practitioner | Admitting: Nurse Practitioner

## 2020-01-25 ENCOUNTER — Encounter: Payer: Self-pay | Admitting: Nurse Practitioner

## 2020-01-25 ENCOUNTER — Other Ambulatory Visit: Payer: Self-pay

## 2020-01-25 ENCOUNTER — Other Ambulatory Visit: Payer: Self-pay | Admitting: Nurse Practitioner

## 2020-01-25 VITALS — BP 140/84 | HR 81 | Temp 98.7°F | Ht 63.0 in | Wt 325.0 lb

## 2020-01-25 DIAGNOSIS — I10 Essential (primary) hypertension: Secondary | ICD-10-CM

## 2020-01-25 DIAGNOSIS — I110 Hypertensive heart disease with heart failure: Secondary | ICD-10-CM | POA: Diagnosis not present

## 2020-01-25 DIAGNOSIS — Z1231 Encounter for screening mammogram for malignant neoplasm of breast: Secondary | ICD-10-CM | POA: Diagnosis not present

## 2020-01-25 DIAGNOSIS — E1165 Type 2 diabetes mellitus with hyperglycemia: Secondary | ICD-10-CM

## 2020-01-25 DIAGNOSIS — Z6841 Body Mass Index (BMI) 40.0 and over, adult: Secondary | ICD-10-CM

## 2020-01-25 DIAGNOSIS — I5022 Chronic systolic (congestive) heart failure: Secondary | ICD-10-CM | POA: Diagnosis not present

## 2020-01-25 DIAGNOSIS — E785 Hyperlipidemia, unspecified: Secondary | ICD-10-CM

## 2020-01-25 LAB — GLUCOSE, POCT (MANUAL RESULT ENTRY): POC Glucose: 121 mg/dl — AB (ref 70–99)

## 2020-01-25 MED ORDER — FUROSEMIDE 20 MG PO TABS
20.0000 mg | ORAL_TABLET | Freq: Every day | ORAL | 0 refills | Status: DC
Start: 2020-01-25 — End: 2020-03-08

## 2020-01-25 MED ORDER — ATORVASTATIN CALCIUM 20 MG PO TABS: 20.0000 mg | ORAL_TABLET | Freq: Every day | ORAL | 3 refills | Status: DC

## 2020-01-25 MED ORDER — SITAGLIPTIN PHOSPHATE 100 MG PO TABS: 100.0000 mg | ORAL_TABLET | Freq: Every day | ORAL | 0 refills | Status: DC

## 2020-01-25 MED ORDER — FUROSEMIDE 20 MG PO TABS
20.0000 mg | ORAL_TABLET | Freq: Every day | ORAL | 0 refills | Status: DC
Start: 2020-01-25 — End: 2020-01-25

## 2020-01-25 MED FILL — ATORVASTATIN CALCIUM 20 MG: 20 | 90 days supply | Qty: 90 | Fill #0

## 2020-01-25 MED FILL — $JANUVIA 100 MG TABLET: 100 | 90 days supply | Qty: 90 | Fill #0

## 2020-01-25 MED FILL — $PROVENTIL HFA 90 MCG INHAL: 108 (90 BAS | 75 days supply | Qty: 3 | Fill #1

## 2020-01-25 MED FILL — FUROSEMIDE 20 MG TABS: 20 | 30 days supply | Qty: 30 | Fill #0

## 2020-01-25 NOTE — Progress Notes (Signed)
Assessment & Plan:  Julie Benjamin was seen today for hypertension.  Diagnoses and all orders for this visit:  Essential hypertension -     Basic metabolic panel Continue all antihypertensives as prescribed.  Remember to bring in your blood pressure log with you for your follow up appointment.  DASH/Mediterranean Diets are healthier choices for HTN.    Type 2 diabetes mellitus with hyperglycemia, without long-term current use of insulin (HCC) -     Basic metabolic panel -     Glucose (CBG) -     sitaGLIPtin (JANUVIA) 100 MG tablet; Take 1 tablet (100 mg total) by mouth daily. Continue blood sugar control as discussed in office today, low carbohydrate diet, and regular physical exercise as tolerated, 150 minutes per week (30 min each day, 5 days per week, or 50 min 3 days per week). Keep blood sugar logs with fasting goal of 90-130 mg/dl, post prandial (after you eat) less than 180.  For Hypoglycemia: BS <60 and Hyperglycemia BS >400; contact the clinic ASAP. Annual eye exams and foot exams are recommended.   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  Breast cancer screening by mammogram -     MM 3D SCREEN BREAST BILATERAL; Future  Obesity, morbid -     TSH Discussed diet and exercise for person with BMI >57. Instructed: You must burn more calories than you eat. Losing 5 percent of your body weight should be considered a success. In the longer term, losing more than 15 percent of your body weight and staying at this weight is an extremely good result. However, keep in mind that even losing 5 percent of your body weight leads to important health benefits, so try not to get discouraged if you're not able to lose more than this. Will recheck weight in 3-6 months.  Dyslipidemia, goal LDL below 70 -     atorvastatin (LIPITOR) 20 MG tablet; Take 1 tablet (20 mg total) by  mouth daily. INSTRUCTIONS: Work on a low fat, heart healthy diet and participate in regular aerobic exercise program by working out at least 150 minutes per week; 5 days a week-30 minutes per day. Avoid red meat/beef/steak,  fried foods. junk foods, sodas, sugary drinks, unhealthy snacking, alcohol and smoking.  Drink at least 80 oz of water per day and monitor your carbohydrate intake daily.      Patient has been counseled on age-appropriate routine health concerns for screening and prevention. These are reviewed and up-to-date. Referrals have been placed accordingly. Immunizations are up-to-date or declined.    Subjective:   Chief Complaint  Patient presents with  . Hypertension    Patient is here for hypertension follow up.    HPI Julie Benjamin 50 y.o. female presents to office today for HTN  She has a past medical history of Acute respiratory failure with hypoxia  (11/01/2018), Asthma (sees Pulmonology), DM2, HTN, OSA with BIPAP (does not use every night but tries to use several nights a week), NICM, CHF (seeing Cardiology but overdue for appt) with EF 25-30%.   She is having difficulty losing weight. Recommended calorie counting, DASH diet. Exercising causes lower back pain. Weight up 25lbs over 6 months.  Uses Bipap "most nights"   Essential Hypertension Blood pressure has improved. She is taking all of her medications as prescribed. Current medications include hydralazine 50 mg BID, coreg 25 mg  BID, entresto 97-125m BID, lasix 287mdaily, spironolactone 25 mg daily. Denies chest pain, worsening shortness of breath, palpitations, lightheadedness, dizziness, headaches or  BLE edema.  BP Readings from Last 3 Encounters:  01/25/20 140/84  11/11/19 (!) 175/120  07/15/19 (!) 162/98   LDL at goal. Taking atorvastatin 20 mg daiily  Lab Results  Component Value Date   LDLCALC 70 11/05/2018   A1c is not due today. Well controlled. Taking Januvia 100 mg daily. Issues with renal  function have limited use of  SGLT Lab Results  Component Value Date   HGBA1C 6.1 11/11/2019   Review of Systems  Constitutional: Negative for fever, malaise/fatigue and weight loss.  HENT: Negative.  Negative for nosebleeds.   Eyes: Negative.  Negative for blurred vision, double vision and photophobia.  Respiratory: Negative.  Negative for cough and shortness of breath.   Cardiovascular: Negative.  Negative for chest pain, palpitations and leg swelling.  Gastrointestinal: Negative.  Negative for heartburn, nausea and vomiting.  Musculoskeletal: Negative.  Negative for myalgias.  Neurological: Negative.  Negative for dizziness, focal weakness, seizures and headaches.  Psychiatric/Behavioral: Negative.  Negative for suicidal ideas.    Past Medical History:  Diagnosis Date  . Acute respiratory failure with hypoxia ([facility]2020  . Asthma   . Diabetes mellitus without complication ([facility]  . Hypertension   . Respiratory failure with hypoxia ([facility]/2020    Past Surgical History:  Procedure Laterality Date  . CESAREAN SECTION    . RIGHT/LEFT HEART CATH AND CORONARY ANGIOGRAPHY N/A 11/05/2018   Procedure: RIGHT/LEFT HEART CATH AND CORONARY ANGIOGRAPHY;  Surgeon: McBurnell BlanksMD;  [facility];  Service: Cardiovascular;  Laterality: N/A;  . TUBAL LIGATION  2001    Family History  Problem Relation Age of Onset  . Hypertension Mother   . COPD Mother   . Kidney failure Sister     Social History Reviewed with no changes to be made today.   Outpatient Medications Prior to Visit  Medication Sig Dispense Refill  . albuterol (PROVENTIL) (2.5 MG/3ML) 0.083% nebulizer solution Take 3 mLs (2.5 mg total) by nebulization every 6 (six) hours as needed for wheezing or shortness of breath. 75 mL 12  . albuterol (VENTOLIN HFA) 108 (90 Base) MCG/ACT inhaler Inhale 1-2 puffs into the lungs every 6 (six) hours as needed for wheezing or shortness of breath. 18 g 12   . Blood Glucose Monitoring Suppl (TRUE METRIX METER) w/Device KIT Use to measure blood sugar twice a day 1 kit 0  . carvedilol (COREG) 25 MG tablet TAKE 1 TABLET (25 MG TOTAL) BY MOUTH 2 (TWO) TIMES DAILY WITH A MEAL. 60 tablet 0  . fluticasone (FLONASE) 50 MCG/ACT nasal spray Place 2 sprays into both nostrils daily. 16 g 1  . glucose blood (TRUE METRIX BLOOD GLUCOSE TEST) test strip Use as instructed 100 each 12  . hydrALAZINE (APRESOLINE) 50 MG tablet Take 1 tablet (50 mg total) by mouth in the morning and at bedtime. 60 tablet 3  . ipratropium-albuterol (DUONEB) 0.5-2.5 (3) MG/3ML SOLN Take 3 mLs by nebulization every 6 (six) hours as needed (for breathing). 360 mL 2  . montelukast (SINGULAIR) 10 MG tablet Take 1 tablet (10 mg total) by mouth at bedtime. 30 tablet 12  . Sacubitril-Valsartan (ENTRESTO PO) Take by mouth in the morning and at bedtime.    . Marland Kitchenpironolactone (ALDACTONE) 25 MG tablet TAKE 1 TABLET (25 MG TOTAL) BY MOUTH DAILY. 30 tablet 0  .  TRUEplus Lancets 28G MISC Use to measure blood sugar twice a day 100 each 1  . budesonide-formoterol (SYMBICORT) 160-4.5 MCG/ACT inhaler Inhale 2 puffs into the lungs 2 (two) times daily. 1 Inhaler 12  . furosemide (LASIX) 20 MG tablet Take 1 tablet (20 mg total) by mouth daily. (Patient not taking: Reported on 01/25/2020) 30 tablet 0  . sitaGLIPtin (JANUVIA) 100 MG tablet Take 1 tablet (100 mg total) by mouth daily. (Patient not taking: Reported on 01/25/2020) 90 tablet 1   No facility-administered medications prior to visit.    Allergies  Allergen Reactions  . Shellfish Allergy        Objective:    BP 140/84 (BP Location: Left Arm, Patient Position: Sitting, Cuff Size: Large)   Pulse 81   Temp 98.7 F (37.1 C) (Oral)   Ht 5' 3"  (1.6 m)   Wt (!) 325 lb (147.4 kg)   SpO2 93%   BMI 57.57 kg/m  Wt Readings from Last 3 Encounters:  01/25/20 (!) 325 lb (147.4 kg)  11/11/19 (!) 321 lb (145.6 kg)  07/15/19 (!) 302 lb 9.6 oz (137.3 kg)     Physical Exam Vitals and nursing note reviewed.  Constitutional:      Appearance: She is well-developed and well-nourished.  HENT:     Head: Normocephalic and atraumatic.  Eyes:     Extraocular Movements: EOM normal.  Cardiovascular:     Rate and Rhythm: Normal rate and regular rhythm.     Pulses: Intact distal pulses.     Heart sounds: Normal heart sounds. No murmur heard. No friction rub. No gallop.   Pulmonary:     Effort: Pulmonary effort is normal. No tachypnea or respiratory distress.     Breath sounds: Normal breath sounds. No decreased breath sounds, wheezing, rhonchi or rales.  Chest:     Chest wall: No tenderness.  Abdominal:     General: Bowel sounds are normal.     Palpations: Abdomen is soft.  Musculoskeletal:        General: No edema. Normal range of motion.     Cervical back: Normal range of motion.  Skin:    General: Skin is warm and dry.  Neurological:     Mental Status: She is alert and oriented to person, place, and time.     Coordination: Coordination normal.  Psychiatric:        Mood and Affect: Mood and affect normal.        Behavior: Behavior normal. Behavior is cooperative.        Thought Content: Thought content normal.        Judgment: Judgment normal.          Patient has been counseled extensively about nutrition and exercise as well as the importance of adherence with medications and regular follow-up. The patient was given clear instructions to go to ER or return to medical center if symptoms don't improve, worsen or new problems develop. The patient verbalized understanding.   Follow-up: Return in about 3 months (around 04/24/2020) for HTN/HPL/DM with PCP;[place].   Gildardo Pounds, FNP-BC [facility], [place]   01/25/2020, 11:48 AM

## 2020-01-25 NOTE — Telephone Encounter (Signed)
JANUVIA PRIOR AUTH APPROVED UNTIL 01/24/21

## 2020-01-26 LAB — BASIC METABOLIC PANEL
BUN/Creatinine Ratio: 13 (ref 9–23)
BUN: 21 mg/dL (ref 6–24)
CO2: 24 mmol/L (ref 20–29)
Calcium: 9.4 mg/dL (ref 8.7–10.2)
Chloride: 104 mmol/L (ref 96–106)
Creatinine, Ser: 1.61 mg/dL — ABNORMAL HIGH (ref 0.57–1.00)
GFR calc Af Amer: 43 mL/min/{1.73_m2} — ABNORMAL LOW (ref >59)
GFR calc non Af Amer: 38 mL/min/{1.73_m2} — ABNORMAL LOW (ref >59)
Glucose: 108 mg/dL — ABNORMAL HIGH (ref 65–99)
Potassium: 4.7 mmol/L (ref 3.5–5.2)
Sodium: 141 mmol/L (ref 134–144)

## 2020-01-26 LAB — TSH: TSH: 1.76 u[IU]/mL (ref 0.450–4.500)

## 2020-02-05 ENCOUNTER — Telehealth: Payer: Self-pay | Admitting: Adult Health

## 2020-02-05 NOTE — Telephone Encounter (Signed)
Called and spoke with pt and she is aware that she can come in on Monday for cxr.  The order is already in the computer.  Nothing further is needed.

## 2020-02-05 NOTE — Telephone Encounter (Cosign Needed)
Pt is wanting to come in for a chest x-ray bc she was informed by Dr. Craige Cotta that she needed one states she can come this upcoming Monday 02/08/20 if possible, she does have an active rqst but it is as of last yr 07/15/19 so didnt know if she needed new one. Pls regard [phone]

## 2020-02-07 NOTE — Progress Notes (Signed)
Cardiology Office Note:   Date:  02/09/2020  NAME:  Julie Benjamin    MRN: [id number] DOB:  [birth year]   PCP:  Charlott Rakes, MD  Cardiologist:  Evalina Field, MD  Electrophysiologist:  None   Referring MD: Charlott Rakes, MD   Chief Complaint  Patient presents with  . Congestive Heart Failure    History of Present Illness:   Julie Benjamin is a 50 y.o. female with a hx of non-ischemic CM, HTN, obesity, DM, HLD who presents for follow-up.  She has been lost to follow-up for over a year.  BP 151/89.  Weights are up to 327.  Recent evaluation by PCP shows a chest x-ray with vascular congestion.  She has been restarted on her heart failure medications.  She was not taking Lasix or spironolactone.  Most recent kidney profile shows normal kidney function.  Things are stable.  She needs repeat echocardiogram.  EKG in office demonstrates normal sinus rhythm.  She reports she is getting short of breath with exertion.  When she does walking for distance she will get short of breath.  No increased lower extremity edema.  Chest x-ray is concerning.  She is on Entresto but does not know the dose.  She is also on carvedilol and Aldactone.  She also is on hydralazine.  I did recommend starting Jardiance which she will do.  We do need to get a repeat echocardiogram and reevaluate her heart function.  She denies any chest pain.  BMI 57.  She has sleep apnea.  Not using her mask as often as she should.  She is can work on this.  Problem List 1. Systolic HF  -EF 97-41% 63/8453 -Normal left heart cath (11/05/2018) 2. Diabetes -A1c 6.1 3. Hypertension  4. HLD -T chol 132, HDL 43, LDL 70, TG 97 5. Obesity -BMI 57 6. OSA  Past Medical History: Past Medical History:  Diagnosis Date  . Acute respiratory failure with hypoxia ([place]) 11/01/2018  . Asthma   . CHF (congestive heart failure) ([place])   . Diabetes mellitus without complication ([place])   . Hypertension   . Respiratory failure with hypoxia ([place])  11/01/2018    Past Surgical History: Past Surgical History:  Procedure Laterality Date  . CESAREAN SECTION    . RIGHT/LEFT HEART CATH AND CORONARY ANGIOGRAPHY N/A 11/05/2018   Procedure: RIGHT/LEFT HEART CATH AND CORONARY ANGIOGRAPHY;  Surgeon: Burnell Blanks, MD;  Location: [facility];  Service: Cardiovascular;  Laterality: N/A;  . TUBAL LIGATION  2001    Current Medications: Current Meds  Medication Sig  . albuterol (PROVENTIL) (2.5 MG/3ML) 0.083% nebulizer solution Take 3 mLs (2.5 mg total) by nebulization every 6 (six) hours as needed for wheezing or shortness of breath.  Marland Kitchen albuterol (VENTOLIN HFA) 108 (90 Base) MCG/ACT inhaler Inhale 1-2 puffs into the lungs every 6 (six) hours as needed for wheezing or shortness of breath.  Marland Kitchen atorvastatin (LIPITOR) 20 MG tablet Take 1 tablet (20 mg total) by mouth daily.  . Blood Glucose Monitoring Suppl (TRUE METRIX METER) w/Device KIT Use to measure blood sugar twice a day  . carvedilol (COREG) 25 MG tablet TAKE 1 TABLET (25 MG TOTAL) BY MOUTH 2 (TWO) TIMES DAILY WITH A MEAL.  Marland Kitchen empagliflozin (JARDIANCE) 10 MG TABS tablet Take 1 tablet (10 mg total) by mouth daily before breakfast.  . furosemide (LASIX) 20 MG tablet Take 1 tablet (20 mg total) by mouth daily.  Marland Kitchen glucose blood (TRUE METRIX BLOOD GLUCOSE  TEST) test strip Use as instructed  . hydrALAZINE (APRESOLINE) 50 MG tablet Take 1 tablet (50 mg total) by mouth in the morning and at bedtime.  Marland Kitchen ipratropium-albuterol (DUONEB) 0.5-2.5 (3) MG/3ML SOLN Take 3 mLs by nebulization every 6 (six) hours as needed (for breathing).  . montelukast (SINGULAIR) 10 MG tablet Take 1 tablet (10 mg total) by mouth at bedtime.  . Sacubitril-Valsartan (ENTRESTO PO) Take by mouth in the morning and at bedtime.  . sitaGLIPtin (JANUVIA) 100 MG tablet Take 1 tablet (100 mg total) by mouth daily.  Marland Kitchen spironolactone (ALDACTONE) 25 MG tablet TAKE 1 TABLET (25 MG TOTAL) BY MOUTH DAILY.  . TRUEplus Lancets  28G MISC Use to measure blood sugar twice a day     Allergies:    Shellfish allergy   Social History: Social History   Socioeconomic History  . Marital status: Single    Spouse name: Not on file  . Number of children: 6  . Years of education: Not on file  . Highest education level: Not on file  Occupational History  . Occupation: disabled  Tobacco Use  . Smoking status: Never Smoker  . Smokeless tobacco: Never Used  Substance and Sexual Activity  . Alcohol use: Never  . Drug use: Never  . Sexual activity: Not on file  Other Topics Concern  . Not on file  Social History Narrative  . Not on file   Social Determinants of Health   Financial Resource Strain: Not on file  Food Insecurity: Not on file  Transportation Needs: Not on file  Physical Activity: Not on file  Stress: Not on file  Social Connections: Not on file     Family History: The patient's family history includes COPD in her mother; Hypertension in her mother; Kidney failure in her sister.  ROS:   All other ROS reviewed and negative. Pertinent positives noted in the HPI.     EKGs/Labs/Other Studies Reviewed:   The following studies were personally reviewed by me today:  EKG:  EKG is ordered today.  The ekg ordered today demonstrates normal sinus rhythm heart rate 80, no acute ischemic changes, poor R wave progression noted, and was personally reviewed by me.   LHC 11/05/2018  1. No angiographic evidence of CAD 2. Elevated filling pressures. (RV 46/6/13, PA 43/20/35, PCWP21, LVEDP 20)  Recommendations: She remains mildly volume overloaded. Will give one dose of IV Lasix tonight. No further ischemic workup.   TTE 11/02/2018 1. Left ventricular ejection fraction, by visual estimation, is 25 to  30%. The left ventricle has severely decreased function. There is mildly  increased left ventricular hypertrophy.  2. Definity contrast agent was given IV to delineate the left ventricular  endocardial  borders.  3. Elevated left atrial pressure.  4. Left ventricular diastolic parameters are consistent with Grade II  diastolic dysfunction (pseudonormalization).  5. Severely dilated left ventricular internal cavity size.  6. Global right ventricle has normal systolic function.The right  ventricular size is moderately enlarged. No increase in right ventricular  wall thickness.  7. Left atrial size was severely dilated.  8. Right atrial size was moderately dilated.  9. The mitral valve is normal in structure. Trace mitral valve  regurgitation.  10. The tricuspid valve is not well visualized. Tricuspid valve  regurgitation is not demonstrated.  11. The aortic valve is tricuspid. Aortic valve regurgitation is not  visualized.    Recent Labs: 07/15/2019: Hemoglobin 13.5; Platelets 320.0 11/11/2019: ALT 11; BNP 36.6 01/25/2020: TSH 1.760  02/08/2020: BUN 10; Creatinine, Ser 0.84; Potassium 4.8; Sodium 136   Recent Lipid Panel    Component Value Date/Time   CHOL 132 11/05/2018 0312   TRIG 97 11/05/2018 0312   HDL 43 11/05/2018 0312   CHOLHDL 3.1 11/05/2018 0312   VLDL 19 11/05/2018 0312   LDLCALC 70 11/05/2018 0312    Physical Exam:   VS:  BP (!) 151/89   Pulse 80   Ht 5' 3"  (1.6 m)   Wt (!) 327 lb 6.4 oz (148.5 kg)   SpO2 96%   BMI 58.00 kg/m    Wt Readings from Last 3 Encounters:  02/09/20 (!) 327 lb 6.4 oz (148.5 kg)  01/25/20 (!) 325 lb (147.4 kg)  11/11/19 (!) 321 lb (145.6 kg)    General: Well nourished, well developed, in no acute distress Head: Atraumatic, normal size  Eyes: PEERLA, EOMI  Neck: Supple, no JVD Endocrine: No thryomegaly Cardiac: Normal S1, S2; RRR; no murmurs, rubs, or gallops Lungs: Diminished breath sounds bilaterally Abd: Soft, nontender, no hepatomegaly  Ext: No edema, pulses 2+ Musculoskeletal: No deformities, BUE and BLE strength normal and equal Skin: Warm and dry, no rashes   Neuro: Alert and oriented to person, place, time, and  situation, CNII-XII grossly intact, no focal deficits  Psych: Normal mood and affect   ASSESSMENT:   Julie Benjamin is a 50 y.o. female who presents for the following: 1. Chronic systolic heart failure ([place])   2. Primary hypertension   3. Mixed hyperlipidemia     PLAN:   1. Chronic systolic heart failure (HCC) -EF 25-30% 11/2018 -Normal left heart cath (11/05/2018) -Diagnosed with congestive heart failure in the setting of uncontrolled hypertension diabetes.  She has been lost to follow-up.  She needs repeat echocardiogram. -Continue Coreg 25 mg twice a day, Aldactone 25 mg daily.  She is on Entresto but does not know the dose.  She will let us know so we can refill this.  She is also on hydralazine.  I recommended going to start Jardiance 10 mg daily.  Chest x-ray yesterday showed volume and congestion. -Her BMI is 57.  Quite difficult to assess her volume status. -She will restart her Lasix 20 mg daily. -I want to see her back in 1 month after echocardiogram.  She should get back on heart failure medications.  She will let us know the Entresto dose.  2. Primary hypertension -BP elevated today.  Recently started back on medications.  We will see how she does over the next 1 month.  3. Mixed hyperlipidemia -Diabetic.  On Lipitor.  Most recent LDL 70.  Left heart catheterization last year with normal coronaries.  Disposition: Return in about 1 month (around 03/08/2020).  Medication Adjustments/Labs and Tests Ordered: Current medicines are reviewed at length with the patient today.  Concerns regarding medicines are outlined above.  Orders Placed This Encounter  Procedures  . EKG 12-Lead  . ECHOCARDIOGRAM COMPLETE   Meds ordered this encounter  Medications  . empagliflozin (JARDIANCE) 10 MG TABS tablet    Sig: Take 1 tablet (10 mg total) by mouth daily before breakfast.    Dispense:  30 tablet    Refill:  3    Patient Instructions  Medication Instructions:  Start Jardiance 10  mg daily   *If you need a refill on your cardiac medications before your next appointment, please call your pharmacy*   Testing/Procedures: Echocardiogram - Your physician has requested that you have an echocardiogram. Echocardiography is a painless  test that uses sound waves to create images of your heart. It provides your doctor with information about the size and shape of your heart and how well your heart's chambers and valves are working. This procedure takes approximately one hour. There are no restrictions for this procedure. This will be performed at our [facility] location - [address].    Follow-Up: At [facility], you and your health needs are our priority.  As part of our continuing mission to provide you with exceptional heart care, we have created designated Provider Care Teams.  These Care Teams include your primary Cardiologist (physician) and Advanced Practice Providers (APPs -  Physician Assistants and Nurse Practitioners) who all work together to provide you with the care you need, when you need it.  We recommend signing up for the patient portal called "MyChart".  Sign up information is provided on this After Visit Summary.  MyChart is used to connect with patients for Virtual Visits (Telemedicine).  Patients are able to view lab/test results, encounter notes, upcoming appointments, etc.  Non-urgent messages can be sent to your provider as well.   To learn more about what you can do with MyChart, go to [email or web address].    Your next appointment:   4 week(s)  The format for your next appointment:   In Person  Provider:   Eleonore Chiquito, MD   Other Instructions Call us with the current Entresto dose.      Time Spent with Patient: I have spent a total of 35 minutes with patient reviewing hospital notes, telemetry, EKGs, labs and examining the patient as well as establishing an assessment and plan that was discussed with the patient.  > 50% of  time was spent in direct patient care.  Signed, Addison Naegeli. Audie Box, [place]  [address], [place], [place] [address] [phone]  02/09/2020 4:35 PM

## 2020-02-08 ENCOUNTER — Ambulatory Visit: Payer: Medicaid Other | Attending: Family Medicine

## 2020-02-08 ENCOUNTER — Ambulatory Visit (INDEPENDENT_AMBULATORY_CARE_PROVIDER_SITE_OTHER): Payer: Medicaid Other

## 2020-02-08 ENCOUNTER — Other Ambulatory Visit: Payer: Self-pay

## 2020-02-08 DIAGNOSIS — J90 Pleural effusion, not elsewhere classified: Secondary | ICD-10-CM | POA: Diagnosis not present

## 2020-02-08 DIAGNOSIS — J45909 Unspecified asthma, uncomplicated: Secondary | ICD-10-CM | POA: Diagnosis not present

## 2020-02-08 DIAGNOSIS — R053 Chronic cough: Secondary | ICD-10-CM

## 2020-02-08 DIAGNOSIS — R059 Cough, unspecified: Secondary | ICD-10-CM | POA: Diagnosis not present

## 2020-02-08 DIAGNOSIS — I517 Cardiomegaly: Secondary | ICD-10-CM | POA: Diagnosis not present

## 2020-02-08 DIAGNOSIS — E1165 Type 2 diabetes mellitus with hyperglycemia: Secondary | ICD-10-CM

## 2020-02-08 DIAGNOSIS — J4550 Severe persistent asthma, uncomplicated: Secondary | ICD-10-CM | POA: Diagnosis not present

## 2020-02-09 ENCOUNTER — Other Ambulatory Visit: Payer: Self-pay | Admitting: Cardiovascular Disease

## 2020-02-09 ENCOUNTER — Ambulatory Visit (INDEPENDENT_AMBULATORY_CARE_PROVIDER_SITE_OTHER): Payer: Medicaid Other | Admitting: Cardiovascular Disease

## 2020-02-09 ENCOUNTER — Encounter: Payer: Self-pay | Admitting: Cardiovascular Disease

## 2020-02-09 VITALS — BP 151/89 | HR 80 | Ht 63.0 in | Wt 327.4 lb

## 2020-02-09 DIAGNOSIS — E782 Mixed hyperlipidemia: Secondary | ICD-10-CM

## 2020-02-09 DIAGNOSIS — I10 Essential (primary) hypertension: Secondary | ICD-10-CM

## 2020-02-09 DIAGNOSIS — I5022 Chronic systolic (congestive) heart failure: Secondary | ICD-10-CM | POA: Diagnosis not present

## 2020-02-09 LAB — BASIC METABOLIC PANEL
BUN/Creatinine Ratio: 12 (ref 9–23)
BUN: 10 mg/dL (ref 6–24)
CO2: 26 mmol/L (ref 20–29)
Calcium: 10.4 mg/dL — ABNORMAL HIGH (ref 8.7–10.2)
Chloride: 97 mmol/L (ref 96–106)
Creatinine, Ser: 0.84 mg/dL (ref 0.57–1.00)
GFR calc Af Amer: 95 mL/min/{1.73_m2} (ref >59)
GFR calc non Af Amer: 82 mL/min/{1.73_m2} (ref >59)
Glucose: 99 mg/dL (ref 65–99)
Potassium: 4.8 mmol/L (ref 3.5–5.2)
Sodium: 136 mmol/L (ref 134–144)

## 2020-02-09 MED ORDER — EMPAGLIFLOZIN 10 MG PO TABS: 10.0000 mg | ORAL_TABLET | Freq: Every day | ORAL | 3 refills | Status: DC

## 2020-02-09 MED FILL — JARDIANCE 10 MG TABLET: 10 | 14 days supply | Qty: 14 | Fill #0

## 2020-02-09 NOTE — Patient Instructions (Signed)
Medication Instructions:  Start Jardiance 10 mg daily   *If you need a refill on your cardiac medications before your next appointment, please call your pharmacy*   Testing/Procedures: Echocardiogram - Your physician has requested that you have an echocardiogram. Echocardiography is a painless test that uses sound waves to create images of your heart. It provides your doctor with information about the size and shape of your heart and how well your heart's chambers and valves are working. This procedure takes approximately one hour. There are no restrictions for this procedure. This will be performed at our [facility] location - [address], [address].    Follow-Up: At [facility], you and your health needs are our priority.  As part of our continuing mission to provide you with exceptional heart care, we have created designated Provider Care Teams.  These Care Teams include your primary Cardiologist (physician) and Advanced Practice Providers (APPs -  Physician Assistants and Nurse Practitioners) who all work together to provide you with the care you need, when you need it.  We recommend signing up for the patient portal called "MyChart".  Sign up information is provided on this After Visit Summary.  MyChart is used to connect with patients for Virtual Visits (Telemedicine).  Patients are able to view lab/test results, encounter notes, upcoming appointments, etc.  Non-urgent messages can be sent to your provider as well.   To learn more about what you can do with MyChart, go to [email or web address].    Your next appointment:   4 week(s)  The format for your next appointment:   In Person  Provider:   Lennie Odor, MD   Other Instructions Call us with the current Entresto dose.

## 2020-02-11 ENCOUNTER — Other Ambulatory Visit (HOSPITAL_COMMUNITY)
Admission: RE | Admit: 2020-02-11 | Discharge: 2020-02-11 | Disposition: A | Discharge status: Home / Self Care | Payer: Medicaid Other | Source: Ambulatory Visit | Attending: Adult Health | Admitting: Adult Health

## 2020-02-11 DIAGNOSIS — Z01812 Encounter for preprocedural laboratory examination: Secondary | ICD-10-CM | POA: Diagnosis present

## 2020-02-11 DIAGNOSIS — Z20822 Contact with and (suspected) exposure to covid-19: Secondary | ICD-10-CM | POA: Insufficient documentation

## 2020-02-11 LAB — SARS CORONAVIRUS 2 (TAT 6-24 HRS): SARS Coronavirus 2: NEGATIVE

## 2020-02-12 ENCOUNTER — Other Ambulatory Visit (HOSPITAL_COMMUNITY): Payer: Medicaid Other

## 2020-02-15 ENCOUNTER — Ambulatory Visit (INDEPENDENT_AMBULATORY_CARE_PROVIDER_SITE_OTHER): Payer: Medicaid Other | Admitting: Pulmonary Disease

## 2020-02-15 ENCOUNTER — Encounter: Payer: Self-pay | Admitting: Acute Care

## 2020-02-15 ENCOUNTER — Other Ambulatory Visit: Payer: Self-pay

## 2020-02-15 ENCOUNTER — Ambulatory Visit (INDEPENDENT_AMBULATORY_CARE_PROVIDER_SITE_OTHER): Payer: Medicaid Other | Admitting: Acute Care

## 2020-02-15 VITALS — BP 128/80 | HR 79 | Temp 97.5°F | Ht 63.0 in | Wt 326.6 lb

## 2020-02-15 DIAGNOSIS — R053 Chronic cough: Secondary | ICD-10-CM | POA: Diagnosis not present

## 2020-02-15 DIAGNOSIS — J4550 Severe persistent asthma, uncomplicated: Secondary | ICD-10-CM

## 2020-02-15 DIAGNOSIS — Z87891 Personal history of nicotine dependence: Secondary | ICD-10-CM | POA: Diagnosis not present

## 2020-02-15 DIAGNOSIS — G4733 Obstructive sleep apnea (adult) (pediatric): Secondary | ICD-10-CM

## 2020-02-15 LAB — PULMONARY FUNCTION TEST
DL/VA % pred: 145 %
DL/VA: 6.37 ml/min/mmHg/L
DLCO cor % pred: 96 %
DLCO cor: 19.83 ml/min/mmHg
DLCO unc % pred: 96 %
DLCO unc: 19.83 ml/min/mmHg
FEF 25-75 Post: 0.65 L/sec
FEF 25-75 Pre: 0.41 L/sec
FEF2575-%Change-Post: 57 %
FEF2575-%Pred-Post: 26 %
FEF2575-%Pred-Pre: 16 %
FEV1-%Change-Post: 23 %
FEV1-%Pred-Post: 37 %
FEV1-%Pred-Pre: 30 %
FEV1-Post: 0.86 L
FEV1-Pre: 0.7 L
FEV1FVC-%Change-Post: 5 %
FEV1FVC-%Pred-Pre: 68 %
FEV6-%Change-Post: 23 %
FEV6-%Pred-Post: 53 %
FEV6-%Pred-Pre: 43 %
FEV6-Post: 1.46 L
FEV6-Pre: 1.18 L
FEV6FVC-%Pred-Post: 102 %
FEV6FVC-%Pred-Pre: 102 %
FVC-%Change-Post: 17 %
FVC-%Pred-Post: 51 %
FVC-%Pred-Pre: 43 %
FVC-Post: 1.46 L
FVC-Pre: 1.24 L
Post FEV1/FVC ratio: 59 %
Post FEV6/FVC ratio: 100 %
Pre FEV1/FVC ratio: 56 %
Pre FEV6/FVC Ratio: 100 %

## 2020-02-15 NOTE — Progress Notes (Signed)
Full PFT performed today. °

## 2020-02-15 NOTE — Patient Instructions (Addendum)
It is good to see you today. We will send in a request for a new mask for your BiPAP machine.  Continue on BiPAP at bedtime. You appear to be benefiting from the treatment  Goal is to wear for at least 6 hours each night for maximal clinical benefit. Continue to work on weight loss, as the link between excess weight  and sleep apnea is well established.   Remember to establish a good bedtime routine, and work on sleep hygiene.  Limit daytime naps , avoid stimulants such as caffeine and nicotine close to bedtime, exercise daily to promote sleep quality, avoid heavy , spicy, fried , or rich foods before bed. Ensure adequate exposure to natural light during the day,establish a relaxing bedtime routine with a pleasant sleep environment ( Bedroom between 60 and 67 degrees, turn off bright lights , TV or device screens screens , consider black out curtains or white noise machines) Do not drive if sleepy. Remember to clean mask, tubing, filter, and reservoir once weekly with soapy water.  Follow up with Dr. Craige Cotta   In 3 months  or before as needed.  We will refer you to [facility]. You need to work on weight loss for both your heart and your lungs. Continue Symbicort 2 puffs twice daily Rinse mouth after use Continue DuoNebs as you have been doing  Use rescue medication for breakthrough shortness of breath or wheezing. Continue Singulair daily. It is ok to add OTC antihistamine as needed.  Continue Lasix and Spironolactone as prescribed. Follow up with Cards ( Echo 3/8, and appointment 3/14) Please contact office for sooner follow up if symptoms do not improve or worsen or seek emergency care

## 2020-02-15 NOTE — Progress Notes (Signed)
History of Present Illness Julie Benjamin is a 50 y.o. female with allergic asthma, OSA and OHS and heart failure.She is followed by Dr. Halford Chessman.    Recent hospitalization for heart failure 10/2019.    Synopsis 50 year old female with hx of non-ischemic allergic asthma, OSA and OHS. CM, HTN, obesity, DM, HLD, and new onset dyspnea. She had been followed by cardiology but  had been lost to follow-up for over a year. At cards follow up : BP 151/89.  Weights are up to 327 lbs.  Recent evaluation by PCP 02/2020 shows a chest x-ray with vascular congestion.  She was  restarted on her heart failure medications.  She had  not been taking Lasix or spironolactone.  Most recent kidney profile showed normal kidney function.   EKG in cards office demonstrated normal sinus rhythm. She reports she has been  getting short of breath with exertion. CXR 2/7 showed Stable enlarged cardiac silhouette,  perihilar vascular congestion and  Small LEFT effusion. She has not  been seen in the pulmonary office since 07/2019.   02/15/2020  Pt. Presents for follow up after PFT's.Marland Kitchen PFT's were done after patient use albuterol at 5:30 am this morning. PFT's are noted below.  She states her breathing has improved slightly since she has been diuresed.She states she has had weight gain of about 20 pounds in the last year.  She is not using her BiPAP. We discussed how important this is.We discussed the direct correlation to untreated OSA and worsening heart failure. Her heart failure has worsened over the last year. She states she is going to start wearing her BiPAP. She is requesting a new face mask. She states she is compliant with her Lasix and her Spironolactone. She has a follow up echo and appointment within the month.  She has been using her Symbicort 2 puffs twice daily. She is using her Duonebs twice daily. She rarely uses her rescue inhaler. She states she is currently in a good place with her asthma. She is using her Singulair  daily she also uses an OTC during the day.  We discussed weight gain, and how this is impacting her breathing. She is open to a referral to [facility].    Test Results: PFT's   Echo 11/02/18 >> EF 25 to 30%, mild LVH, grade 2 DD  RHC/LHC 11/05/18 >> no CAD; RV 46/6/13, PA 43/20/35, PCWP 21 CBC Latest Ref Rng & Units 07/15/2019 11/18/2018 11/07/2018  WBC 4.0 - 10.5 K/uL 11.2(H) 12.2(H) 10.5  Hemoglobin 12.0 - 15.0 g/dL 13.5 12.6 12.4  Hematocrit 36.0 - 46.0 % 40.6 40.4 41.7  Platelets 150.0 - 400.0 K/uL 320.0 355 361    BMP Latest Ref Rng & Units 02/08/2020 01/25/2020 11/11/2019  Glucose 65 - 99 mg/dL 99 108(H) 87  BUN 6 - 24 mg/dL 10 21 27(H)  Creatinine 0.57 - 1.00 mg/dL 0.84 1.61(H) 1.51(H)  BUN/Creat Ratio 9 - 23 12 13 18   Sodium 134 - 144 mmol/L 136 141 144  Potassium 3.5 - 5.2 mmol/L 4.8 4.7 4.5  Chloride 96 - 106 mmol/L 97 104 105  CO2 20 - 29 mmol/L 26 24 24   Calcium 8.7 - 10.2 mg/dL 10.4(H) 9.4 9.2    BNP    Component Value Date/Time   BNP 36.6 11/11/2019 1624   BNP 931.6 (H) 10/31/2018 1947    ProBNP No results found for: PROBNP  PFT No results found for: FEV1PRE, FEV1POST, FVCPRE, FVCPOST, TLC, DLCOUNC, PREFEV1FVCRT, PSTFEV1FVCRT  DG Chest 2 View  Result Date: 02/08/2020 CLINICAL DATA:  Cough and asthma EXAM: CHEST - 2 VIEW COMPARISON:  11/05/2018 FINDINGS: Stable enlarged cardiac silhouette. There is perihilar vascular congestion. No focal infiltrate. Small LEFT effusion. No pneumothorax. IMPRESSION: Cardiomegaly and vascular congestion suggest volume overload. Electronically Signed   By: Suzy Bouchard M.D.   On: 02/08/2020 16:05     Past medical hx Past Medical History:  Diagnosis Date  . Acute respiratory failure with hypoxia ([place]) 11/01/2018  . Asthma   . CHF (congestive heart failure) ([place])   . Diabetes mellitus without complication ([place])   . Hypertension   . Respiratory failure with hypoxia ([place]) 11/01/2018     Social History    Tobacco Use  . Smoking status: Never Smoker  . Smokeless tobacco: Never Used  Substance Use Topics  . Alcohol use: Never  . Drug use: Never    Ms.Kuri reports that she has never smoked. She has never used smokeless tobacco. She reports that she does not drink alcohol and does not use drugs.  Tobacco Cessation: Former smoker , quit 1992 with a 4 pack year smoking history  Past surgical hx, Family hx, Social hx all reviewed.  Current Outpatient Medications on File Prior to Visit  Medication Sig  . albuterol (PROVENTIL) (2.5 MG/3ML) 0.083% nebulizer solution Take 3 mLs (2.5 mg total) by nebulization every 6 (six) hours as needed for wheezing or shortness of breath.  Marland Kitchen albuterol (VENTOLIN HFA) 108 (90 Base) MCG/ACT inhaler Inhale 1-2 puffs into the lungs every 6 (six) hours as needed for wheezing or shortness of breath.  Marland Kitchen atorvastatin (LIPITOR) 20 MG tablet Take 1 tablet (20 mg total) by mouth daily.  . Blood Glucose Monitoring Suppl (TRUE METRIX METER) w/Device KIT Use to measure blood sugar twice a day  . budesonide-formoterol (SYMBICORT) 160-4.5 MCG/ACT inhaler Inhale 2 puffs into the lungs 2 (two) times daily.  . carvedilol (COREG) 25 MG tablet TAKE 1 TABLET (25 MG TOTAL) BY MOUTH 2 (TWO) TIMES DAILY WITH A MEAL.  Marland Kitchen empagliflozin (JARDIANCE) 10 MG TABS tablet Take 1 tablet (10 mg total) by mouth daily before breakfast.  . fluticasone (FLONASE) 50 MCG/ACT nasal spray Place 2 sprays into both nostrils daily. (Patient not taking: Reported on 02/09/2020)  . furosemide (LASIX) 20 MG tablet Take 1 tablet (20 mg total) by mouth daily.  Marland Kitchen glucose blood (TRUE METRIX BLOOD GLUCOSE TEST) test strip Use as instructed  . hydrALAZINE (APRESOLINE) 50 MG tablet Take 1 tablet (50 mg total) by mouth in the morning and at bedtime.  Marland Kitchen ipratropium-albuterol (DUONEB) 0.5-2.5 (3) MG/3ML SOLN Take 3 mLs by nebulization every 6 (six) hours as needed (for breathing).  . montelukast (SINGULAIR) 10 MG tablet  Take 1 tablet (10 mg total) by mouth at bedtime.  . Sacubitril-Valsartan (ENTRESTO PO) Take by mouth in the morning and at bedtime.  . sitaGLIPtin (JANUVIA) 100 MG tablet Take 1 tablet (100 mg total) by mouth daily.  Marland Kitchen spironolactone (ALDACTONE) 25 MG tablet TAKE 1 TABLET (25 MG TOTAL) BY MOUTH DAILY.  . TRUEplus Lancets 28G MISC Use to measure blood sugar twice a day   No current facility-administered medications on file prior to visit.     Allergies  Allergen Reactions  . Shellfish Allergy     Review Of Systems:  Constitutional:   No  weight loss, night sweats,  Fevers, chills, fatigue, or  lassitude.  HEENT:   No headaches,  Difficulty swallowing,  Tooth/dental problems, or  Sore throat,                No sneezing, itching, ear ache, nasal congestion, post nasal drip,   CV:  No chest pain,  Orthopnea, PND, swelling in lower extremities, anasarca, dizziness, palpitations, syncope.   GI  No heartburn, indigestion, abdominal pain, nausea, vomiting, diarrhea, change in bowel habits, loss of appetite, bloody stools.   Resp: + shortness of breath with exertion or at rest.  No excess mucus, no productive cough,  No non-productive cough,  No coughing up of blood.  No change in color of mucus.  No wheezing.  No chest wall deformity  Skin: no rash or lesions.  GU: no dysuria, change in color of urine, no urgency or frequency.  No flank pain, no hematuria   MS:  No joint pain or swelling.  No decreased range of motion.  No back pain.  Psych:  No change in mood or affect. No depression or anxiety.  No memory loss.   Vital Signs There were no vitals taken for this visit.   Physical Exam:  General- No distress,  A&Ox3, pleasant ENT: No sinus tenderness, TM clear, pale nasal mucosa, no oral exudate,no post nasal drip, no LAN Cardiac: S1, S2, regular rate and rhythm, no murmur Chest: No wheeze/ rales/ dullness; no accessory muscle use, no nasal flaring, no sternal retractions Abd.:  Soft Non-tender, ND, BS +, Body mass index is 57.85 kg/m. Ext: No clubbing cyanosis, edema Neuro:  normal strength, MAE x 4, A&O x 3 Skin: No rashes, warm and dry, no lesions Psych: normal mood and behavior   Assessment/Plan  Allergic asthma with acute exacerbation. - continue symbicort, singulair, prn albuterol - will get records from pulmonary in [place], [place] as you have been doing  Obstructive sleep apnea with obesity hypoventilation syndrome. Non-compliant with BiPAP - educated on how untreated BiPAP can worsen heart failure - Encouraged to wear every night no matter what. - We will enroll patient in [employer] - get Bipap download from DME - needs new face mask  Non ischemic CM./ Diastolic HF - f/u with cardiology today re: weight gain and edema.  - ( Echo 3/8, and appointment 3/14)  Super Morbid Obesity Plan Referral to [facility]   Magdalen Spatz, NP 02/15/2020  10:30 AM

## 2020-02-16 ENCOUNTER — Ambulatory Visit: Payer: Medicaid Other | Admitting: Adult Health

## 2020-02-16 ENCOUNTER — Telehealth: Payer: Self-pay | Admitting: Acute Care

## 2020-02-16 DIAGNOSIS — G4733 Obstructive sleep apnea (adult) (pediatric): Secondary | ICD-10-CM

## 2020-02-16 NOTE — Telephone Encounter (Signed)
Please advise about the sleep study that will be needed.  thanks

## 2020-02-16 NOTE — Telephone Encounter (Signed)
Order was placed yesterday for bipap/supplies & pt stated she used [employer].  We do not have a sleep study on file.I called [employer] this morning & spoke to [facility].  Asked for a copy of sleep study to be faxed to me so we could scan to our chart & told her I had order to send to her.  She states pt was set up as private pay.  They never had a study.  Pt was given bipap thru the hospital on 11/07/18.  Hospital sent them letter of intent & they provided pt with machine.  Pt now has Medicaid.  She states for them to bill for supplies pt will have to have a sleep study since insurance company was never provided with a sleep study. This will be true for any dme since pt never had study. Since pt has Medicaid she will need to have an inlab study for it to be covered.

## 2020-02-17 NOTE — Telephone Encounter (Signed)
Okay.  Please place order for split night sleep study and let pt know about requirements for insurance coverage.

## 2020-02-17 NOTE — Progress Notes (Signed)
Reviewed and agree with assessment/plan.   Marialena Wollen, MD [facility] 02/17/2020, 11:02 AM Pager:  [phone]  

## 2020-02-17 NOTE — Telephone Encounter (Signed)
The issue here is the patient has not been using her BiPAP . Because of this non-compliance, she will have to have a new study. [employer]) It would be nice to have the original study though, to have documentation in her medical record though. Thanks so much

## 2020-02-17 NOTE — Telephone Encounter (Signed)
She had original sleep testing in [place] with Dr.Chrindar Amad in [place], [place].  Previously attempted to get records from [place], but haven't received records to date.  Please contact patient to have sleep study results from [place] set to our office to be shared with DME.  If she is unable to get these records, then she will need to have an in lab split night sleep study.

## 2020-02-17 NOTE — Telephone Encounter (Signed)
ATC patient left detailed message per DPR letting her know that we need copy of sleep study from [place] and that due to her non compliance with her BIPAP she would have to have a new sleep study done.  LMTCB

## 2020-02-18 NOTE — Addendum Note (Signed)
Addended by: Benjie Karvonen R on: 02/18/2020 10:23 AM   Modules accepted: Orders

## 2020-02-18 NOTE — Telephone Encounter (Signed)
Called and spoke with patient to let her know of messages from Sarah and Dr. Craige Cotta in regards to her BIPAP and sleep study. Let her know that since she has been non compliant with her machine she will have to do a new sleep study per her insurance. Also asked her about her sleep study from [place]. She said it was done at [facility] in [place]. I called the hospital and was asked to fax over medical release form. Patient stated that she does not drive and asked for form to be mailed to her. This has been placed in mail and it will be faxed to the sleep center once we receive it back to [phone]. Order placed for split night oer Dr. Craige Cotta. Nothing further needed at this time.

## 2020-02-25 ENCOUNTER — Other Ambulatory Visit: Payer: Self-pay | Admitting: Nurse Practitioner

## 2020-02-25 ENCOUNTER — Other Ambulatory Visit: Payer: Self-pay | Admitting: Critical Care Medicine

## 2020-02-25 DIAGNOSIS — E1165 Type 2 diabetes mellitus with hyperglycemia: Secondary | ICD-10-CM

## 2020-03-08 ENCOUNTER — Ambulatory Visit (HOSPITAL_COMMUNITY): Payer: Medicaid Other | Attending: Cardiovascular Disease

## 2020-03-08 ENCOUNTER — Other Ambulatory Visit: Payer: Self-pay | Admitting: Cardiovascular Disease

## 2020-03-08 ENCOUNTER — Other Ambulatory Visit: Payer: Self-pay | Admitting: Nurse Practitioner

## 2020-03-08 ENCOUNTER — Other Ambulatory Visit: Payer: Self-pay

## 2020-03-08 ENCOUNTER — Telehealth: Payer: Self-pay

## 2020-03-08 DIAGNOSIS — I5022 Chronic systolic (congestive) heart failure: Secondary | ICD-10-CM | POA: Diagnosis not present

## 2020-03-08 DIAGNOSIS — I110 Hypertensive heart disease with heart failure: Secondary | ICD-10-CM

## 2020-03-08 LAB — ECHOCARDIOGRAM COMPLETE
Area-P 1/2: 3.08 cm2
S' Lateral: 4.1 cm

## 2020-03-08 MED ORDER — PERFLUTREN LIPID MICROSPHERE
2.0000 mL | INTRAVENOUS | Status: AC | PRN
  Administered 2020-03-08: 2 mL via INTRAVENOUS

## 2020-03-08 MED FILL — FUROSEMIDE 20 MG TABS: 20 | 90 days supply | Qty: 90 | Fill #0

## 2020-03-08 MED FILL — ALBUTEROL 0.083% INHAL SOLN: (2.5 MG/3ML | 6 days supply | Qty: 75 | Fill #1

## 2020-03-08 MED FILL — CARVEDILOL 25 MG TABLET: 25 | 10 days supply | Qty: 20 | Fill #0

## 2020-03-08 MED FILL — hydrALAZINE HCL 50 MG TABS: 50 | 30 days supply | Qty: 60 | Fill #2

## 2020-03-08 MED FILL — SPIRONOLACTONE 25 MG TABLET: 25 | 10 days supply | Qty: 10 | Fill #0

## 2020-03-08 MED FILL — $Symbicort 160-4.5mcg/act: 160-4.5 | 30 days supply | Qty: 1 | Fill #1

## 2020-03-08 MED FILL — MONTELUKAST SOD 10 MG TAB: 10 | 30 days supply | Qty: 30 | Fill #1

## 2020-03-08 NOTE — Telephone Encounter (Signed)
Called patient she needs a PA for her Jardiance 10 mg- I attempted to complete this, but was unable to reach anyone on the phone, and was unable to complete via covermymeds.   Can you check on this one as well?   Thank you!

## 2020-03-09 NOTE — Telephone Encounter (Signed)
**Note De-Identified Juliani Laduke Obfuscation** Jardiance PA started through covermymeds. Key: [id number]

## 2020-03-10 MED FILL — JARDIANCE 10 MG TABLET: 10 | 30 days supply | Qty: 30 | Fill #0

## 2020-03-10 MED FILL — IPRAT-ALBUT 0.5-3(2.5) MG/3: 0.5-2.5 (3) | 15 days supply | Qty: 180 | Fill #1

## 2020-03-10 NOTE — Telephone Encounter (Signed)
Pt c/o medication issue:  1. Name of Medication: Jardiance 10 MG  2. How are you currently taking this medication (dosage and times per day)? N/A  3. Are you having a reaction (difficulty breathing--STAT)? N/A  4. What is your medication issue?   Patient is following up. She states someone with [facility] made her aware that even with a PA, the medication will not be covered. She is requesting to discuss this with Dr. Marylene Buerger nurse.

## 2020-03-10 NOTE — Telephone Encounter (Signed)
Jardiance still not going through on insurance, advised may take a few more days Did place Jardiance 10 mg #2 Lot [id number] EXP 9/23 at front for patient to pick up  Will forward to Lind Covert LPN  so she will be aware

## 2020-03-11 NOTE — Telephone Encounter (Signed)
Thank you so much Larita Fife!

## 2020-03-11 NOTE — Telephone Encounter (Signed)
Message from covermymeds: Jasmine Awe Key: [id number] - PA Case ID: [id number] Outcome: Approved on March 9 Request Reference Number: [id number].  JARDIANCE TAB 10MG  is approved through 03/09/2021. Drug Jardiance 10MG  tablets Form: [employer] Medicaid Electronic Prior Authorization Form (2017 NCPDP)  I have notified Shayla at [facility] pharmacy of this approval. 10-21-2004 states that the pts Jardiance RX is ready for pick and the pts cost is now $3/30 day supply. She states they have notified pt that it is ready for pick up.

## 2020-03-13 NOTE — Progress Notes (Deleted)
Cardiology Office Note:   Date:  03/13/2020  NAME:  Julie Benjamin    MRN: [id number] DOB:  06/07/1970   PCP:  Hoy Register, MD  Cardiologist:  Reatha Harps, MD  Electrophysiologist:  None   Referring MD: Hoy Register, MD   No chief complaint on file. ***  History of Present Illness:   Julie Benjamin is a 50 y.o. female with a hx of systolic HF with recovery of EF, HTN, DM, obesity who presents for follow-up. EF has recovered.   Problem List 1. Systolic HF, with recovery of EF -EF 25-30% 11/2018 -Normal left heart cath (11/05/2018) -EF 55-60% 03/2020 2. Diabetes -A1c 6.1 3. Hypertension  4. HLD -T chol 132, HDL 43, LDL 70, TG 97 5. Obesity -BMI 57 6. OSA  Past Medical History: Past Medical History:  Diagnosis Date  . Acute respiratory failure with hypoxia ([facility]) 11/01/2018  . Asthma   . CHF (congestive heart failure) ([facility])   . Diabetes mellitus without complication ([facility])   . Hypertension   . Respiratory failure with hypoxia ([facility]) 11/01/2018    Past Surgical History: Past Surgical History:  Procedure Laterality Date  . CESAREAN SECTION    . RIGHT/LEFT HEART CATH AND CORONARY ANGIOGRAPHY N/A 11/05/2018   Procedure: RIGHT/LEFT HEART CATH AND CORONARY ANGIOGRAPHY;  Surgeon: Kathleene Hazel, MD;  Location: [facility] INVASIVE CV LAB;  Service: Cardiovascular;  Laterality: N/A;  . TUBAL LIGATION  2001    Current Medications: No outpatient medications have been marked as taking for the 03/14/20 encounter (Appointment) with O'Neal, Ronnald Ramp, MD.     Allergies:    Shellfish allergy   Social History: Social History   Socioeconomic History  . Marital status: Single    Spouse name: Not on file  . Number of children: 6  . Years of education: Not on file  . Highest education level: Not on file  Occupational History  . Occupation: disabled  Tobacco Use  . Smoking status: Former Smoker    Packs/day: 1.00    Years: 2.00    Pack years: 2.00    Types:  Cigarettes    Start date: 76    Quit date: 1992    Years since quitting: 30.2  . Smokeless tobacco: Never Used  Substance and Sexual Activity  . Alcohol use: Never  . Drug use: Never  . Sexual activity: Not on file  Other Topics Concern  . Not on file  Social History Narrative  . Not on file   Social Determinants of Health   Financial Resource Strain: Not on file  Food Insecurity: Not on file  Transportation Needs: Not on file  Physical Activity: Not on file  Stress: Not on file  Social Connections: Not on file     Family History: The patient's ***family history includes COPD in her mother; Hypertension in her mother; Kidney failure in her sister.  ROS:   All other ROS reviewed and negative. Pertinent positives noted in the HPI.     EKGs/Labs/Other Studies Reviewed:   The following studies were personally reviewed by me today:  EKG:  EKG is *** ordered today.  The ekg ordered today demonstrates ***, and was personally reviewed by me.   TTE 03/08/2020  1. Left ventricular ejection fraction, by estimation, is 55 to 60%. The  left ventricle has normal function. The left ventricle has no regional  wall motion abnormalities. There is mild concentric left ventricular  hypertrophy. Left ventricular diastolic  parameters are consistent with Grade  I diastolic dysfunction (impaired  relaxation).  2. Right ventricular systolic function is normal. The right ventricular  size is normal. Tricuspid regurgitation signal is inadequate for assessing  PA pressure.  3. The mitral valve is grossly normal. No evidence of mitral valve  regurgitation. No evidence of mitral stenosis.  4. The aortic valve is grossly normal. Aortic valve regurgitation is not  visualized. No aortic stenosis is present.   Recent Labs: 07/15/2019: Hemoglobin 13.5; Platelets 320.0 11/11/2019: ALT 11; BNP 36.6 01/25/2020: TSH 1.760 02/08/2020: BUN 10; Creatinine, Ser 0.84; Potassium 4.8; Sodium 136   Recent  Lipid Panel    Component Value Date/Time   CHOL 132 11/05/2018 0312   TRIG 97 11/05/2018 0312   HDL 43 11/05/2018 0312   CHOLHDL 3.1 11/05/2018 0312   VLDL 19 11/05/2018 0312   LDLCALC 70 11/05/2018 0312    Physical Exam:   VS:  There were no vitals taken for this visit.   Wt Readings from Last 3 Encounters:  02/15/20 (!) 326 lb 9.6 oz (148.1 kg)  02/09/20 (!) 327 lb 6.4 oz (148.5 kg)  01/25/20 (!) 325 lb (147.4 kg)    General: Well nourished, well developed, in no acute distress Head: Atraumatic, normal size  Eyes: PEERLA, EOMI  Neck: Supple, no JVD Endocrine: No thryomegaly Cardiac: Normal S1, S2; RRR; no murmurs, rubs, or gallops Lungs: Clear to auscultation bilaterally, no wheezing, rhonchi or rales  Abd: Soft, nontender, no hepatomegaly  Ext: No edema, pulses 2+ Musculoskeletal: No deformities, BUE and BLE strength normal and equal Skin: Warm and dry, no rashes   Neuro: Alert and oriented to person, place, time, and situation, CNII-XII grossly intact, no focal deficits  Psych: Normal mood and affect   ASSESSMENT:   Julie Benjamin is a 50 y.o. female who presents for the following: No diagnosis found.  PLAN:   There are no diagnoses linked to this encounter.  Disposition: No follow-ups on file.  Medication Adjustments/Labs and Tests Ordered: Current medicines are reviewed at length with the patient today.  Concerns regarding medicines are outlined above.  No orders of the defined types were placed in this encounter.  No orders of the defined types were placed in this encounter.   There are no Patient Instructions on file for this visit.   Time Spent with Patient: I have spent a total of *** minutes with patient reviewing hospital notes, telemetry, EKGs, labs and examining the patient as well as establishing an assessment and plan that was discussed with the patient.  > 50% of time was spent in direct patient care.  Signed, Lenna Gilford. Flora Lipps, MD, [facility]  [facility]  [address] [place], [place] [address] [phone]  03/13/2020 7:54 PM

## 2020-03-14 ENCOUNTER — Ambulatory Visit: Payer: Medicaid Other | Admitting: Cardiovascular Disease

## 2020-03-14 DIAGNOSIS — I10 Essential (primary) hypertension: Secondary | ICD-10-CM

## 2020-03-14 DIAGNOSIS — I5022 Chronic systolic (congestive) heart failure: Secondary | ICD-10-CM

## 2020-03-14 DIAGNOSIS — E782 Mixed hyperlipidemia: Secondary | ICD-10-CM

## 2020-03-17 NOTE — Progress Notes (Signed)
Cardiology Office Note:   Date:  03/18/2020  NAME:  Julie Benjamin    MRN: [id number] DOB:  15-Jan-1970   PCP:  Charlott Rakes, MD  Cardiologist:  Evalina Field, MD  Electrophysiologist:  None   Referring MD: Charlott Rakes, MD   Chief Complaint  Patient presents with  . Congestive Heart Failure   History of Present Illness:   Julie Benjamin is a 50 y.o. female with a hx of HTN, systolic HF with recovery of EF, DM, HLD who presents for follow-up. EF has recovered.  She reports some chest pain this morning.  Describes atypical sharp pressure.  Symptoms have improved with relaxation.  EKG in office shows normal sinus rhythm.  We discussed that this is likely from overuse.  Pain she was doing a lot of cleaning yesterday.  This is different from her asthma but symptoms appear to be improving.  She has normal coronary arteries.  I am not concerned about this.  Her blood pressure a bit elevated 142/92.  She is out of Coreg and Aldactone.  I did express that she needs to be on her medications not missed doses.  She will work on this.  We will send in refills.  She cannot miss doses of medications.  She does have some trace edema.  She will take her Lasix today.  She denies any shortness of breath in the office.  Her weights are still elevated.  She is going to see healthy weight and wellness.  Hopefully they can work with her.  Problem List 1. Systolic HF  -EF 22-97% 98/9211 -EF 55-60% 03/2020 -Normal left heart cath (11/05/2018) 2. Diabetes -A1c 6.1 3. Hypertension  4. HLD -T chol 132, HDL 43, LDL 70, TG 97 5. Obesity -BMI 57 6. OSA  Past Medical History: Past Medical History:  Diagnosis Date  . Acute respiratory failure with hypoxia ([place]) 11/01/2018  . Asthma   . CHF (congestive heart failure) ([place])   . Diabetes mellitus without complication ([place])   . Hypertension   . Respiratory failure with hypoxia ([place]) 11/01/2018    Past Surgical History: Past Surgical History:  Procedure  Laterality Date  . CESAREAN SECTION    . RIGHT/LEFT HEART CATH AND CORONARY ANGIOGRAPHY N/A 11/05/2018   Procedure: RIGHT/LEFT HEART CATH AND CORONARY ANGIOGRAPHY;  Surgeon: Burnell Blanks, MD;  Location: [facility];  Service: Cardiovascular;  Laterality: N/A;  . TUBAL LIGATION  2001    Current Medications: Current Meds  Medication Sig  . albuterol (PROVENTIL) (2.5 MG/3ML) 0.083% nebulizer solution Take 3 mLs (2.5 mg total) by nebulization every 6 (six) hours as needed for wheezing or shortness of breath.  Marland Kitchen albuterol (VENTOLIN HFA) 108 (90 Base) MCG/ACT inhaler Inhale 1-2 puffs into the lungs every 6 (six) hours as needed for wheezing or shortness of breath.  Marland Kitchen atorvastatin (LIPITOR) 20 MG tablet Take 1 tablet (20 mg total) by mouth daily.  . Blood Glucose Monitoring Suppl (TRUE METRIX METER) w/Device KIT Use to measure blood sugar twice a day  . budesonide-formoterol (SYMBICORT) 160-4.5 MCG/ACT inhaler Inhale 2 puffs into the lungs 2 (two) times daily.  . fluticasone (FLONASE) 50 MCG/ACT nasal spray Place 2 sprays into both nostrils daily.  Marland Kitchen glucose blood (TRUE METRIX BLOOD GLUCOSE TEST) test strip Use as instructed  . hydrALAZINE (APRESOLINE) 50 MG tablet Take 1 tablet (50 mg total) by mouth in the morning and at bedtime.  Marland Kitchen ipratropium-albuterol (DUONEB) 0.5-2.5 (3) MG/3ML SOLN Take 3 mLs by  nebulization every 6 (six) hours as needed (for breathing).  . montelukast (SINGULAIR) 10 MG tablet Take 1 tablet (10 mg total) by mouth at bedtime.  . sitaGLIPtin (JANUVIA) 100 MG tablet Take 1 tablet (100 mg total) by mouth daily.  . TRUEplus Lancets 28G MISC Use to measure blood sugar twice a day  . [DISCONTINUED] carvedilol (COREG) 25 MG tablet Take 1 tablet (25 mg total) by mouth 2 (two) times daily with a meal.  . [DISCONTINUED] empagliflozin (JARDIANCE) 10 MG TABS tablet Take 1 tablet (10 mg total) by mouth daily before breakfast.  . [DISCONTINUED] furosemide (LASIX) 20 MG  tablet TAKE 1 TABLET (20 MG TOTAL) BY MOUTH DAILY.  . [DISCONTINUED] sacubitril-valsartan (ENTRESTO) 97-103 MG Take 97-103 mg by mouth in the morning and at bedtime.  . [DISCONTINUED] spironolactone (ALDACTONE) 25 MG tablet TAKE 1 TABLET (25 MG TOTAL) BY MOUTH DAILY.     Allergies:    Shellfish allergy   Social History: Social History   Socioeconomic History  . Marital status: Single    Spouse name: Not on file  . Number of children: 6  . Years of education: Not on file  . Highest education level: Not on file  Occupational History  . Occupation: disabled  Tobacco Use  . Smoking status: Former Smoker    Packs/day: 1.00    Years: 2.00    Pack years: 2.00    Types: Cigarettes    Start date: 35    Quit date: 1992    Years since quitting: 30.2  . Smokeless tobacco: Never Used  Substance and Sexual Activity  . Alcohol use: Never  . Drug use: Never  . Sexual activity: Not on file  Other Topics Concern  . Not on file  Social History Narrative  . Not on file   Social Determinants of Health   Financial Resource Strain: Not on file  Food Insecurity: Not on file  Transportation Needs: Not on file  Physical Activity: Not on file  Stress: Not on file  Social Connections: Not on file     Family History: The patient's family history includes COPD in her mother; Hypertension in her mother; Kidney failure in her sister.  ROS:   All other ROS reviewed and negative. Pertinent positives noted in the HPI.     EKGs/Labs/Other Studies Reviewed:   The following studies were personally reviewed by me today:  EKG:  EKG is ordered today.  The ekg ordered today demonstrates normal sinus rhythm heart rate 80, no acute ischemic changes or evidence of infarction, and was personally reviewed by me.   TTE 03/08/2020 1. Left ventricular ejection fraction, by estimation, is 55 to 60%. The  left ventricle has normal function. The left ventricle has no regional  wall motion abnormalities.  There is mild concentric left ventricular  hypertrophy. Left ventricular diastolic  parameters are consistent with Grade I diastolic dysfunction (impaired  relaxation).  2. Right ventricular systolic function is normal. The right ventricular  size is normal. Tricuspid regurgitation signal is inadequate for assessing  PA pressure.  3. The mitral valve is grossly normal. No evidence of mitral valve  regurgitation. No evidence of mitral stenosis.  4. The aortic valve is grossly normal. Aortic valve regurgitation is not  visualized. No aortic stenosis is present.   Recent Labs: 07/15/2019: Hemoglobin 13.5; Platelets 320.0 11/11/2019: ALT 11; BNP 36.6 01/25/2020: TSH 1.760 02/08/2020: BUN 10; Creatinine, Ser 0.84; Potassium 4.8; Sodium 136   Recent Lipid Panel    Component  Value Date/Time   CHOL 132 11/05/2018 0312   TRIG 97 11/05/2018 0312   HDL 43 11/05/2018 0312   CHOLHDL 3.1 11/05/2018 0312   VLDL 19 11/05/2018 0312   LDLCALC 70 11/05/2018 0312    Physical Exam:   VS:  BP (!) 142/92 (BP Location: Left Arm, Patient Position: Sitting)   Pulse 80   Ht 5' 3"  (1.6 m)   Wt (!) 330 lb (149.7 kg)   SpO2 96%   BMI 58.46 kg/m    Wt Readings from Last 3 Encounters:  03/18/20 (!) 330 lb (149.7 kg)  02/15/20 (!) 326 lb 9.6 oz (148.1 kg)  02/09/20 (!) 327 lb 6.4 oz (148.5 kg)    General: Well nourished, well developed, in no acute distress Head: Atraumatic, normal size  Eyes: PEERLA, EOMI  Neck: Supple, no JVD Endocrine: No thryomegaly Cardiac: Normal S1, S2; RRR; no murmurs, rubs, or gallops Lungs: Clear to auscultation bilaterally, no wheezing, rhonchi or rales  Abd: Soft, nontender, no hepatomegaly  Ext: Trace edema Musculoskeletal: No deformities, BUE and BLE strength normal and equal Skin: Warm and dry, no rashes   Neuro: Alert and oriented to person, place, time, and situation, CNII-XII grossly intact, no focal deficits  Psych: Normal mood and affect   ASSESSMENT:    Emalynn Clewis is a 50 y.o. female who presents for the following: 1. Chronic systolic heart failure ([place])   2. Primary hypertension   3. Mixed hyperlipidemia   4. Hypertensive heart disease with chronic systolic congestive heart failure ([place])     PLAN:   1. Chronic systolic heart failure ([facility]) -EF 25-30% 11/2018 -EF 55-60% 03/2020 -Normal left heart cath (11/05/2018) -EF has recovered.  Suspect this was related to uncontrolled hypertension.  I expressed that she must take her medications and not miss doses.  She is on a good regimen.  She will continue Coreg 25 twice a day, Entresto 97-1 03 twice a day, Aldactone 25 mg daily, Jardiance 10 mg daily, Lasix 20 mg daily.  Her diabetes is now much improved.  She overall is at goal.  Hopefully she will continue to improve and lose weight.  2. Primary hypertension -Heart failure medications as above.  3. Mixed hyperlipidemia -Most recent LDL 70.  At goal for her diabetes.  She had a normal left heart catheterization.  Disposition: Return in about 6 months (around 09/18/2020).  Medication Adjustments/Labs and Tests Ordered: Current medicines are reviewed at length with the patient today.  Concerns regarding medicines are outlined above.  Orders Placed This Encounter  Procedures  . EKG 12-Lead   Meds ordered this encounter  Medications  . spironolactone (ALDACTONE) 25 MG tablet    Sig: Take 1 tablet (25 mg total) by mouth daily.    Dispense:  30 tablet    Refill:  3  . sacubitril-valsartan (ENTRESTO) 97-103 MG    Sig: Take 1 tablet by mouth in the morning and at bedtime.    Dispense:  60 tablet    Refill:  3  . carvedilol (COREG) 25 MG tablet    Sig: Take 1 tablet (25 mg total) by mouth 2 (two) times daily with a meal.    Dispense:  180 tablet    Refill:  3  . empagliflozin (JARDIANCE) 10 MG TABS tablet    Sig: Take 1 tablet (10 mg total) by mouth daily before breakfast.    Dispense:  30 tablet    Refill:  3  . furosemide (LASIX)  20 MG  tablet    Sig: Take 1 tablet (20 mg total) by mouth daily.    Dispense:  90 tablet    Refill:  1    Patient Instructions  Medication Instructions:  The current medical regimen is effective;  continue present plan and medications.  *If you need a refill on your cardiac medications before your next appointment, please call your pharmacy*   Follow-Up: At [facility], you and your health needs are our priority.  As part of our continuing mission to provide you with exceptional heart care, we have created designated Provider Care Teams.  These Care Teams include your primary Cardiologist (physician) and Advanced Practice Providers (APPs -  Physician Assistants and Nurse Practitioners) who all work together to provide you with the care you need, when you need it.  We recommend signing up for the patient portal called "MyChart".  Sign up information is provided on this After Visit Summary.  MyChart is used to connect with patients for Virtual Visits (Telemedicine).  Patients are able to view lab/test results, encounter notes, upcoming appointments, etc.  Non-urgent messages can be sent to your provider as well.   To learn more about what you can do with MyChart, go to [email or web address].    Your next appointment:   6 month(s)  The format for your next appointment:   In Person  Provider:   Eleonore Chiquito, MD        Time Spent with Patient: I have spent a total of 25 minutes with patient reviewing hospital notes, telemetry, EKGs, labs and examining the patient as well as establishing an assessment and plan that was discussed with the patient.  > 50% of time was spent in direct patient care.  Signed, Addison Naegeli. Audie Box, MD, [facility]  [address] [place], [place] [address] [phone]  03/18/2020 1:16 PM

## 2020-03-18 ENCOUNTER — Ambulatory Visit (INDEPENDENT_AMBULATORY_CARE_PROVIDER_SITE_OTHER): Payer: Medicaid Other | Admitting: Cardiovascular Disease

## 2020-03-18 ENCOUNTER — Other Ambulatory Visit: Payer: Self-pay | Admitting: Cardiovascular Disease

## 2020-03-18 ENCOUNTER — Encounter: Payer: Self-pay | Admitting: Cardiovascular Disease

## 2020-03-18 ENCOUNTER — Other Ambulatory Visit: Payer: Self-pay

## 2020-03-18 VITALS — BP 142/92 | HR 80 | Ht 63.0 in | Wt 330.0 lb

## 2020-03-18 DIAGNOSIS — I10 Essential (primary) hypertension: Secondary | ICD-10-CM

## 2020-03-18 DIAGNOSIS — I5022 Chronic systolic (congestive) heart failure: Secondary | ICD-10-CM | POA: Diagnosis not present

## 2020-03-18 DIAGNOSIS — E782 Mixed hyperlipidemia: Secondary | ICD-10-CM | POA: Diagnosis not present

## 2020-03-18 DIAGNOSIS — I110 Hypertensive heart disease with heart failure: Secondary | ICD-10-CM

## 2020-03-18 MED ORDER — EMPAGLIFLOZIN 10 MG PO TABS: 10.0000 mg | ORAL_TABLET | Freq: Every day | ORAL | 3 refills | Status: DC

## 2020-03-18 MED ORDER — SACUBITRIL-VALSARTAN 97-103 MG PO TABS: 1.0000 | ORAL_TABLET | Freq: Two times a day (BID) | ORAL | 3 refills | Status: DC

## 2020-03-18 MED ORDER — CARVEDILOL 25 MG PO TABS: 25.0000 mg | ORAL_TABLET | Freq: Two times a day (BID) | ORAL | 3 refills | Status: DC

## 2020-03-18 MED ORDER — SPIRONOLACTONE 25 MG PO TABS: 25.0000 mg | ORAL_TABLET | Freq: Every day | ORAL | 3 refills | Status: DC

## 2020-03-18 MED ORDER — FUROSEMIDE 20 MG PO TABS: 20.0000 mg | ORAL_TABLET | Freq: Every day | ORAL | 1 refills | Status: DC

## 2020-03-18 NOTE — Patient Instructions (Signed)
Medication Instructions:  The current medical regimen is effective;  continue present plan and medications.  *If you need a refill on your cardiac medications before your next appointment, please call your pharmacy*   Follow-Up: At [facility], you and your health needs are our priority.  As part of our continuing mission to provide you with exceptional heart care, we have created designated Provider Care Teams.  These Care Teams include your primary Cardiologist (physician) and Advanced Practice Providers (APPs -  Physician Assistants and Nurse Practitioners) who all work together to provide you with the care you need, when you need it.  We recommend signing up for the patient portal called "MyChart".  Sign up information is provided on this After Visit Summary.  MyChart is used to connect with patients for Virtual Visits (Telemedicine).  Patients are able to view lab/test results, encounter notes, upcoming appointments, etc.  Non-urgent messages can be sent to your provider as well.   To learn more about what you can do with MyChart, go to [email or web address].    Your next appointment:   6 month(s)  The format for your next appointment:   In Person  Provider:   Virden O'Neal, MD      

## 2020-03-23 ENCOUNTER — Other Ambulatory Visit: Payer: Self-pay

## 2020-03-23 ENCOUNTER — Ambulatory Visit
Admission: RE | Admit: 2020-03-23 | Discharge: 2020-03-23 | Disposition: A | Discharge status: Home / Self Care | Payer: Medicaid Other | Source: Ambulatory Visit | Attending: Nurse Practitioner | Admitting: Nurse Practitioner

## 2020-03-23 DIAGNOSIS — Z1231 Encounter for screening mammogram for malignant neoplasm of breast: Secondary | ICD-10-CM

## 2020-04-03 ENCOUNTER — Other Ambulatory Visit: Payer: Self-pay

## 2020-04-03 MED FILL — Carvedilol Tab 25 MG: ORAL | 90 days supply | Qty: 180 | Fill #0 | Status: AC

## 2020-04-03 MED FILL — Spironolactone Tab 25 MG: ORAL | 30 days supply | Qty: 30 | Fill #0 | Status: AC

## 2020-04-05 ENCOUNTER — Encounter (HOSPITAL_BASED_OUTPATIENT_CLINIC_OR_DEPARTMENT_OTHER): Payer: Medicaid Other | Admitting: Pulmonary Disease

## 2020-04-05 ENCOUNTER — Other Ambulatory Visit: Payer: Self-pay

## 2020-04-05 ENCOUNTER — Telehealth: Payer: Self-pay | Admitting: Family Medicine

## 2020-04-05 NOTE — Telephone Encounter (Signed)
Copied from CRM [id number]. Topic: General - Other >> Mar 30, 2020 12:14 PM Gwenlyn Fudge wrote: Reason for CRM: Pt called and is requesting to have the results for her mammogram. Please advise.

## 2020-04-11 ENCOUNTER — Telehealth: Payer: Self-pay | Admitting: Family Medicine

## 2020-04-11 NOTE — Telephone Encounter (Signed)
Patient would like the nurse or doctor to call in some medication for her cough.  She already has an appt. For 4/25 but just wanted to see if she could get something for her cough now.  Please advise.  CB# [phone]

## 2020-04-12 NOTE — Telephone Encounter (Signed)
Pt will need to wait for her office visit to discuss cough.

## 2020-04-13 ENCOUNTER — Other Ambulatory Visit: Payer: Self-pay

## 2020-04-13 ENCOUNTER — Ambulatory Visit: Payer: Self-pay | Admitting: *Deleted

## 2020-04-13 MED ORDER — BUDESONIDE-FORMOTEROL FUMARATE 160-4.5 MCG/ACT IN AERO
2.0000 | INHALATION_SPRAY | Freq: Two times a day (BID) | RESPIRATORY_TRACT | 2 refills | Status: DC
  Filled 2020-04-13: qty 10.2, 30d supply, fill #0

## 2020-04-13 MED FILL — Empagliflozin Tab 10 MG: ORAL | 30 days supply | Qty: 30 | Fill #0 | Status: AC

## 2020-04-13 NOTE — Telephone Encounter (Signed)
  I returned pt's call.  She has a cough from her allergies and wants to know what OTC cough medication she can safely take with her prescription medications.  I advised her to talk with her pharmacist since she is on so many medications.   She uses the pharmacy at [facility].   She was agreeable to calling that pharmacist for further advice.  She was asking about a prescription for a weight loss clinic.   I let her know she would need to talk with Dr. Alvis Lemmings regarding that.   Pt said she had an appt coming up on 04/25/2020 and that she would discuss it with Dr. Alvis Lemmings during that appt.   Reason for Disposition . [1] Caller has medicine question about med NOT prescribed by PCP AND [2] triager unable to answer question (e.g., compatibility with other med, storage)    What OTC cough medication can she take with her medications?  Answer Assessment - Initial Assessment Questions 1. NAME of MEDICATION: "What medicine are you calling about?"     I have a cough that comes and goes.    My heart doctor says my heart is fine.   It may be my allergies worse at night.  2. QUESTION: "What is your question?" (e.g., medication refill, side effect)     What OTC cough can I take with my medications.   3. PRESCRIBING HCP: "Who prescribed it?" Reason: if prescribed by specialist, call should be referred to that group.     Dr. Alvis Lemmings and my heart doctor 4. SYMPTOMS: "Do you have any symptoms?"     Just a cough that is worse at night from my allergies. 5. SEVERITY: If symptoms are present, ask "Are they mild, moderate or severe?"     More bothersome at night 6. PREGNANCY:  "Is there any chance that you are pregnant?" "When was your last menstrual period?"     N/A due to age  Protocols used: MEDICATION QUESTION CALL-A-AH

## 2020-04-25 ENCOUNTER — Other Ambulatory Visit: Payer: Self-pay

## 2020-04-25 ENCOUNTER — Encounter: Payer: Self-pay | Admitting: Family Medicine

## 2020-04-25 ENCOUNTER — Ambulatory Visit: Payer: Medicaid Other | Attending: Family Medicine | Admitting: Family Medicine

## 2020-04-25 VITALS — BP 155/78 | HR 78 | Resp 18 | Ht 63.0 in | Wt 322.4 lb

## 2020-04-25 DIAGNOSIS — E1165 Type 2 diabetes mellitus with hyperglycemia: Secondary | ICD-10-CM

## 2020-04-25 DIAGNOSIS — I110 Hypertensive heart disease with heart failure: Secondary | ICD-10-CM

## 2020-04-25 DIAGNOSIS — J45909 Unspecified asthma, uncomplicated: Secondary | ICD-10-CM | POA: Diagnosis not present

## 2020-04-25 DIAGNOSIS — J302 Other seasonal allergic rhinitis: Secondary | ICD-10-CM | POA: Diagnosis not present

## 2020-04-25 DIAGNOSIS — I5022 Chronic systolic (congestive) heart failure: Secondary | ICD-10-CM | POA: Diagnosis not present

## 2020-04-25 LAB — POCT GLYCOSYLATED HEMOGLOBIN (HGB A1C): HbA1c, POC (controlled diabetic range): 6 % (ref 0.0–7.0)

## 2020-04-25 LAB — GLUCOSE, POCT (MANUAL RESULT ENTRY): POC Glucose: 101 mg/dl — AB (ref 70–99)

## 2020-04-25 MED ORDER — PREDNISONE 20 MG PO TABS
20.0000 mg | ORAL_TABLET | Freq: Every day | ORAL | 0 refills | Status: AC
  Filled 2020-04-25: qty 5, 5d supply, fill #0

## 2020-04-25 MED ORDER — HYDRALAZINE HCL 50 MG PO TABS
ORAL_TABLET | ORAL | 3 refills | Status: AC
  Filled 2020-04-25: qty 60, 30d supply, fill #0
  Filled 2020-06-23 – 2020-07-21 (×2): qty 60, 30d supply, fill #1
  Filled 2020-08-24: qty 60, 30d supply, fill #2
  Filled 2020-10-03 – 2020-11-15 (×3): qty 60, 30d supply, fill #3

## 2020-04-25 MED ORDER — BUDESONIDE-FORMOTEROL FUMARATE 160-4.5 MCG/ACT IN AERO
2.0000 | INHALATION_SPRAY | Freq: Two times a day (BID) | RESPIRATORY_TRACT | 2 refills | Status: AC
  Filled 2020-04-25 – 2020-07-21 (×3): qty 10.2, 30d supply, fill #0
  Filled 2020-08-24: qty 10.2, 30d supply, fill #1
  Filled 2020-10-03 – 2020-11-15 (×3): qty 10.2, 30d supply, fill #2

## 2020-04-25 MED ORDER — ALBUTEROL SULFATE HFA 108 (90 BASE) MCG/ACT IN AERS
INHALATION_SPRAY | RESPIRATORY_TRACT | 3 refills | Status: DC
  Filled 2020-04-25: qty 8.5, 25d supply, fill #0
  Filled 2020-06-23 – 2020-07-21 (×2): qty 8.5, 25d supply, fill #1
  Filled 2020-08-24: qty 8.5, 25d supply, fill #2
  Filled 2020-10-03: qty 8.5, 25d supply, fill #3

## 2020-04-25 NOTE — Progress Notes (Signed)
Subjective:  Patient ID: Julie Benjamin, female    DOB: 04-17-1970  Age: 50 y.o. MRN: [id number]  CC: 3 Month follow up    HPI Julie Benjamin a 50 year old female with history of asthma, obstructive sleep apnea, obesity hypoventilation syndrome, hypertension, heart failure with reduced EF (EF 55 to 60% from 03/2020), type 2 diabetes mellitus (A1c 6.0) here for chronic disease management. She has dry cough and sometimes has liquid in her mouth which makes her gag. Has been occurring for a couple of weeks. Symptoms are worse at night Sometimes it affects her breathing and causes asthma exacerbation.  She endorses compliance with her asthma medications. Denies presence of chest pain, palpitations, pedal edema, orthopnea. Currently on Jardiance and Januvia for her diabetes mellitus and denies presence of hypoglycemia. Compliant with her antihypertensive and is tolerating her medications. Referred to the weight loss center. She got a call from the weight loss center but has some upfront fees to be paid prior to receiving an appointment but she plans to do so. Past Medical History:  Diagnosis Date  . Acute respiratory failure with hypoxia ([place]) 11/01/2018  . Asthma   . CHF (congestive heart failure) ([place])   . Diabetes mellitus without complication ([place])   . Hypertension   . Respiratory failure with hypoxia ([place]) 11/01/2018    Past Surgical History:  Procedure Laterality Date  . CESAREAN SECTION    . RIGHT/LEFT HEART CATH AND CORONARY ANGIOGRAPHY N/A 11/05/2018   Procedure: RIGHT/LEFT HEART CATH AND CORONARY ANGIOGRAPHY;  Surgeon: Burnell Blanks, MD;  Location: [facility];  Service: Cardiovascular;  Laterality: N/A;  . TUBAL LIGATION  2001    Family History  Problem Relation Age of Onset  . Hypertension Mother   . COPD Mother   . Kidney failure Sister     Allergies  Allergen Reactions  . Shellfish Allergy     Outpatient Medications Prior to Visit  Medication Sig  Dispense Refill  . albuterol (PROVENTIL) (2.5 MG/3ML) 0.083% nebulizer solution TAKE 3 MLS (1 VIAL) BY NEBULIZATION EVERY 6 (SIX) HOURS AS NEEDED FOR WHEEZING OR SHORTNESS OF BREATH. 75 mL 12  . atorvastatin (LIPITOR) 20 MG tablet TAKE 1 TABLET BY MOUTH ONCE A DAY 90 tablet 3  . Blood Glucose Monitoring Suppl (TRUE METRIX METER) w/Device KIT Use to measure blood sugar twice a day 1 kit 0  . carvedilol (COREG) 25 MG tablet TAKE 1 TABLET (25 MG TOTAL) BY MOUTH 2 (TWO) TIMES DAILY WITH A MEAL. 180 tablet 3  . empagliflozin (JARDIANCE) 10 MG TABS tablet TAKE 1 TABLET (10 MG TOTAL) BY MOUTH DAILY BEFORE BREAKFAST. 30 tablet 3  . fluticasone (FLONASE) 50 MCG/ACT nasal spray PLACE 2 SPRAYS INTO BOTH NOSTRILS DAILY. 16 g 1  . furosemide (LASIX) 20 MG tablet TAKE 1 TABLET (20 MG TOTAL) BY MOUTH DAILY. 90 tablet 1  . glucose blood (TRUE METRIX BLOOD GLUCOSE TEST) test strip Use as instructed 100 each 12  . ipratropium-albuterol (DUONEB) 0.5-2.5 (3) MG/3ML SOLN TAKE 3 MLS BY NEBULIZATION EVERY 6 (SIX) HOURS AS NEEDED (FOR BREATHING). 360 mL 2  . montelukast (SINGULAIR) 10 MG tablet TAKE 1 TABLET (10 MG TOTAL) BY MOUTH AT BEDTIME. 30 tablet 12  . sacubitril-valsartan (ENTRESTO) 97-103 MG TAKE 1 TABLET BY MOUTH IN THE MORNING AND AT BEDTIME. 60 tablet 3  . spironolactone (ALDACTONE) 25 MG tablet TAKE 1 TABLET (25 MG TOTAL) BY MOUTH DAILY. 30 tablet 3  . TRUEplus Lancets 28G MISC Use to  measure blood sugar twice a day 100 each 1  . albuterol (VENTOLIN HFA) 108 (90 Base) MCG/ACT inhaler INHALE 1-2 PUFFS INTO THE LUNGS EVERY 6 HOURS AS NEEDED FOR WHEEZING OR SHORTNESS OF BREATH 1 g 12  . budesonide-formoterol (SYMBICORT) 160-4.5 MCG/ACT inhaler inhale 2 puffs into the lungs two times daily 10.2 g 2  . hydrALAZINE (APRESOLINE) 50 MG tablet TAKE 1 TABLET (50 MG TOTAL) BY MOUTH IN THE MORNING AND AT BEDTIME. 60 tablet 3  . budesonide-formoterol (SYMBICORT) 160-4.5 MCG/ACT inhaler Inhale 2 puffs into the lungs 2 (two)  times daily. 1 Inhaler 12  . sitaGLIPtin (JANUVIA) 100 MG tablet Take 1 tablet (100 mg total) by mouth daily. 90 tablet 0   No facility-administered medications prior to visit.     ROS Review of Systems  Constitutional: Negative for activity change, appetite change and fatigue.  HENT: Negative for congestion, sinus pressure and sore throat.   Eyes: Negative for visual disturbance.  Respiratory: Negative for cough, chest tightness, shortness of breath and wheezing.   Cardiovascular: Negative for chest pain and palpitations.  Gastrointestinal: Negative for abdominal distention, abdominal pain and constipation.  Endocrine: Negative for polydipsia.  Genitourinary: Negative for dysuria and frequency.  Musculoskeletal: Negative for arthralgias and back pain.  Skin: Negative for rash.  Neurological: Negative for tremors, light-headedness and numbness.  Hematological: Does not bruise/bleed easily.  Psychiatric/Behavioral: Negative for agitation and behavioral problems.    Objective:  BP (!) 155/78   Pulse 78   Resp 18   Ht _0  (1.6 m)   Wt (!) 322 lb 6.4 oz (146.2 kg)   SpO2 96%   BMI 57.11 kg/m   BP/Weight 04/25/2020 03/18/2020 6/96/2952  Systolic BP 841 324 401  Diastolic BP 78 92 80  Wt. (Lbs) 322.4 330 326.6  BMI 57.11 58.46 57.85      Physical Exam Constitutional:      Appearance: She is well-developed.  Neck:     Vascular: No JVD.  Cardiovascular:     Rate and Rhythm: Normal rate.     Heart sounds: Normal heart sounds. No murmur heard.   Pulmonary:     Effort: Pulmonary effort is normal.     Breath sounds: Normal breath sounds. No wheezing or rales.  Chest:     Chest wall: No tenderness.  Abdominal:     General: Bowel sounds are normal. There is no distension.     Palpations: Abdomen is soft. There is no mass.     Tenderness: There is no abdominal tenderness.  Musculoskeletal:        General: Normal range of motion.     Right lower leg: No edema.      Left lower leg: No edema.  Neurological:     Mental Status: She is alert and oriented to person, place, and time.  Psychiatric:        Mood and Affect: Mood normal.     Diabetic Foot Exam - Simple   Simple Foot Form Diabetic Foot exam was performed with the following findings: Yes 04/25/2020 10:25 AM  Visual Inspection No deformities, no ulcerations, no other skin breakdown bilaterally: Yes Sensation Testing Intact to touch and monofilament testing bilaterally: Yes Pulse Check Posterior Tibialis and Dorsalis pulse intact bilaterally: Yes Comments     CMP Latest Ref Rng & Units 02/08/2020 01/25/2020 11/11/2019  Glucose 65 - 99 mg/dL 99 108(H) 87  BUN 6 - 24 mg/dL 10 21 27(H)  Creatinine 0.57 - 1.00 mg/dL 0.84 1.61(H)  1.51(H)  Sodium 134 - 144 mmol/L 136 141 144  Potassium 3.5 - 5.2 mmol/L 4.8 4.7 4.5  Chloride 96 - 106 mmol/L 97 104 105  CO2 20 - 29 mmol/L _0 Calcium 8.7 - 10.2 mg/dL 10.4(H) 9.4 9.2  Total Protein 6.0 - 8.5 g/dL - - 7.1  Total Bilirubin 0.0 - 1.2 mg/dL - - <0.2  Alkaline Phos 44 - 121 IU/L - - 81  AST 0 - 40 IU/L - - 9  ALT 0 - 32 IU/L - - 11    Lipid Panel     Component Value Date/Time   CHOL 132 11/05/2018 0312   TRIG 97 11/05/2018 0312   HDL 43 11/05/2018 0312   CHOLHDL 3.1 11/05/2018 0312   VLDL 19 11/05/2018 0312   LDLCALC 70 11/05/2018 0312    CBC    Component Value Date/Time   WBC 11.2 (H) 07/15/2019 1137   RBC 4.29 07/15/2019 1137   HGB 13.5 07/15/2019 1137   HGB 12.6 11/18/2018 1059   HCT 40.6 07/15/2019 1137   HCT 40.4 11/18/2018 1059   PLT 320.0 07/15/2019 1137   PLT 355 11/18/2018 1059   MCV 94.5 07/15/2019 1137   MCV 92 11/18/2018 1059   MCH 28.8 11/18/2018 1059   MCH 28.8 11/07/2018 0525   MCHC 33.4 07/15/2019 1137   RDW 13.9 07/15/2019 1137   RDW 14.1 11/18/2018 1059   LYMPHSABS 1.5 07/15/2019 1137   LYMPHSABS 1.1 11/18/2018 1059   MONOABS 0.9 07/15/2019 1137   EOSABS 0.0 07/15/2019 1137   EOSABS 0.0 11/18/2018  1059   BASOSABS 0.1 07/15/2019 1137   BASOSABS 0.1 11/18/2018 1059    Lab Results  Component Value Date   HGBA1C 6.0 04/25/2020    Assessment & Plan:  1. Type 2 diabetes mellitus with hyperglycemia, without long-term current use of insulin (HCC) Controlled with A1c of 6.0 Continue current management Counseled on Diabetic diet, my plate method, 638 minutes of moderate intensity exercise/week Blood sugar logs with fasting goals of 80-120 mg/dl, random of less than 180 and in the event of sugars less than 60 mg/dl or greater than 400 mg/dl encouraged to notify the clinic. Advised on the need for annual eye exams, annual foot exams, Pneumonia vaccine. - POCT glucose (manual entry) - POCT glycosylated hemoglobin (Hb A1C) - Microalbumin / creatinine urine ratio  2. Hypertensive heart disease with chronic systolic congestive heart failure (HCC) Elevated blood pressure; previous blood pressures have been controlled No regimen change today EF of 55 to 60% She is euvolemic Continue medications Follow-up with cardiology Counseled on blood pressure goal of less than 130/80, low-sodium, DASH diet, medication compliance, 150 minutes of moderate intensity exercise per week. Discussed medication compliance, adverse effects. - hydrALAZINE (APRESOLINE) 50 MG tablet; TAKE 1 TABLET (50 MG TOTAL) BY MOUTH IN THE MORNING AND AT BEDTIME.  Dispense: 60 tablet; Refill: 3  3. Asthma, unspecified asthma severity, unspecified whether complicated, unspecified whether persistent Intermittent exacerbation due to seasonal allergies Continue asthma medications We will place on short course of prednisone - albuterol (VENTOLIN HFA) 108 (90 Base) MCG/ACT inhaler; INHALE 1-2 PUFFS INTO THE LUNGS EVERY 6 HOURS AS NEEDED FOR WHEEZING OR SHORTNESS OF BREATH  Dispense: 18 g; Refill: 3  4. Seasonal allergies See #3 above Continue antihistamine, Flonase - predniSONE (DELTASONE) 20 MG tablet; Take 1 tablet (20 mg  total) by mouth daily with breakfast.  Dispense: 5 tablet; Refill: 0 - budesonide-formoterol (SYMBICORT) 160-4.5 MCG/ACT inhaler; inhale 2  puffs into the lungs two times daily  Dispense: 10.2 g; Refill: 2    Meds ordered this encounter  Medications  . predniSONE (DELTASONE) 20 MG tablet    Sig: Take 1 tablet (20 mg total) by mouth daily with breakfast.    Dispense:  5 tablet    Refill:  0  . hydrALAZINE (APRESOLINE) 50 MG tablet    Sig: TAKE 1 TABLET (50 MG TOTAL) BY MOUTH IN THE MORNING AND AT BEDTIME.    Dispense:  60 tablet    Refill:  3  . budesonide-formoterol (SYMBICORT) 160-4.5 MCG/ACT inhaler    Sig: inhale 2 puffs into the lungs two times daily    Dispense:  10.2 g    Refill:  2  . albuterol (VENTOLIN HFA) 108 (90 Base) MCG/ACT inhaler    Sig: INHALE 1-2 PUFFS INTO THE LUNGS EVERY 6 HOURS AS NEEDED FOR WHEEZING OR SHORTNESS OF BREATH    Dispense:  18 g    Refill:  3    Follow-up: Return in about 1 month (around 05/25/2020) for Complete physical exam.       Charlott Rakes, MD, FAAFP. [facility] [address] and [place], [place]   04/25/2020, 10:26 AM

## 2020-04-25 NOTE — Patient Instructions (Signed)
Allergies, Adult An allergy means that your body reacts to something that bothers it (allergen). This can happen from something that you eat, breathe in, or touch. Allergies often affect the nose, eyes, skin, and stomach. They can be mild, moderate, or very bad (severe). An allergy cannot spread from person to person. They can happen at any age. Sometimes, people outgrow them. What are the causes?  Outdoor things, such as pollen, car fumes, and mold.  Indoor things, such as dust, smoke, mold, and pets.  Foods.  Medicines.  Things that bother your skin, such as perfume and bug bites. What increases the risk?  Having family members with allergies or asthma. What are the signs or symptoms? Symptoms depend on how bad your allergy is. Mild to moderate symptoms  Runny nose, stuffy nose, or sneezing.  Itchy mouth, ears, or throat.  A feeling of mucus dripping down the back of your throat.  Sore throat.  Eyes that are itchy, red, watery, or puffy.  A skin rash, or red, swollen areas of skin (hives).  Stomach cramps or bloating. Severe symptoms Very bad allergies to food, medicine, or bug bites may cause a very bad allergy reaction (anaphylaxis). This can be life-threatening. Symptoms include:  A red face.  Wheezing or coughing.  Swollen lips, tongue, or mouth.  Tight or swollen throat.  Chest pain or tightness, or a fast heartbeat.  Trouble breathing or shortness of breath.  Pain in your belly (abdomen), vomiting, or watery poop (diarrhea).  Feeling dizzy or fainting. How is this treated? Treatment for this condition depends on your symptoms. Treatment may include:  Cold, wet cloths for itching and swelling.  Eye drops, nose sprays, or skin creams.  Washing out your nose each day.  A humidifier.  Medicines.  A change to the foods you eat.  Being exposed again and again to tiny amounts of allergens. This helps your body get used to them. You might  have: ? Allergy shots. ? Very small amounts of allergen put under your tongue.  An emergency shot (auto-injector pen) if you have a very bad allergy reaction. ? This is a medicine with a needle. You can put it into your skin by yourself. ? Your doctor will teach you how to use it.      Follow these instructions at home: Medicines  Take or apply over-the-counter and prescription medicines only as told by your doctor.  If you are at risk for a very bad allergy reaction, keep an auto-injector pen with you all the time.   Eating and drinking  Follow instructions from your doctor about what to eat and drink.  Drink enough fluid to keep your pee (urine) pale yellow. General instructions  If you have ever had a very bad allergy reaction, wear a medical alert bracelet or necklace.  Stay away from things that you are allergic to.  Keep all follow-up visits as told by your doctor. This is important. Contact a doctor if:  Your symptoms do not get better with treatment. Get help right away if:  You have symptoms of a very bad allergy reaction. These include: ? A swollen mouth, tongue, or throat. ? Pain or tightness in your chest. ? Trouble breathing. ? Being short of breath. ? Dizziness. ? Fainting. ? Very bad pain in your belly. ? Vomiting. ? Watery poop. These symptoms may be an emergency. Do not wait to see if the symptoms will go away. Get medical help right away. Call your local   emergency services (911 in the [place] Do not drive yourself to the hospital. Summary  Take or apply over-the-counter and prescription medicines only as told by your doctor.  Stay away from things you are allergic to.  If you are at risk for a very bad allergy reaction, carry an auto-injector pen all the time.  Wear a medical alert bracelet or necklace.  Very bad allergy reactions can be life-threatening. Get help right away. This information is not intended to replace advice given to you by your  health care provider. Make sure you discuss any questions you have with your health care provider. Document Revised: 10/29/2018 Document Reviewed: 10/29/2018 Elsevier Patient Education  2021 Elsevier Inc.  

## 2020-04-26 LAB — MICROALBUMIN / CREATININE URINE RATIO
Creatinine, Urine: 222.5 mg/dL
Microalb/Creat Ratio: 21 mg/g creat (ref 0–29)
Microalbumin, Urine: 46.4 ug/mL

## 2020-05-03 ENCOUNTER — Other Ambulatory Visit: Payer: Self-pay

## 2020-05-03 ENCOUNTER — Other Ambulatory Visit: Payer: Self-pay | Admitting: Nurse Practitioner

## 2020-05-03 MED ORDER — JANUVIA 100 MG PO TABS: 100.0000 mg | ORAL_TABLET | Freq: Every day | ORAL | 11 refills | Status: DC

## 2020-05-03 MED FILL — Spironolactone Tab 25 MG: ORAL | 30 days supply | Qty: 30 | Fill #1 | Status: AC

## 2020-05-03 MED FILL — Atorvastatin Calcium Tab 20 MG (Base Equivalent): ORAL | 30 days supply | Qty: 30 | Fill #0 | Status: AC

## 2020-05-03 MED FILL — Montelukast Sodium Tab 10 MG (Base Equiv): ORAL | 30 days supply | Qty: 30 | Fill #0 | Status: AC

## 2020-05-04 ENCOUNTER — Other Ambulatory Visit: Payer: Self-pay

## 2020-05-05 ENCOUNTER — Other Ambulatory Visit: Payer: Self-pay | Admitting: Pharmacist

## 2020-05-05 ENCOUNTER — Other Ambulatory Visit: Payer: Self-pay

## 2020-05-05 DIAGNOSIS — E1165 Type 2 diabetes mellitus with hyperglycemia: Secondary | ICD-10-CM

## 2020-05-05 MED ORDER — SITAGLIPTIN PHOSPHATE 100 MG PO TABS
100.0000 mg | ORAL_TABLET | Freq: Every day | ORAL | 0 refills | Status: DC
  Filled 2020-05-05: qty 90, 90d supply, fill #0

## 2020-05-09 ENCOUNTER — Other Ambulatory Visit: Payer: Self-pay

## 2020-05-09 MED FILL — Empagliflozin Tab 10 MG: ORAL | 30 days supply | Qty: 30 | Fill #1 | Status: AC

## 2020-05-11 ENCOUNTER — Other Ambulatory Visit: Payer: Self-pay

## 2020-05-14 ENCOUNTER — Encounter (HOSPITAL_BASED_OUTPATIENT_CLINIC_OR_DEPARTMENT_OTHER): Payer: Medicaid Other | Admitting: Pulmonary Disease

## 2020-06-05 ENCOUNTER — Ambulatory Visit (HOSPITAL_BASED_OUTPATIENT_CLINIC_OR_DEPARTMENT_OTHER): Payer: Medicaid Other | Attending: Pulmonary Disease | Admitting: Pulmonary Disease

## 2020-06-08 ENCOUNTER — Encounter: Payer: Medicaid Other | Admitting: Family Medicine

## 2020-06-23 ENCOUNTER — Other Ambulatory Visit: Payer: Self-pay

## 2020-06-23 MED FILL — Ipratropium-Albuterol Nebu Soln 0.5-2.5(3) MG/3ML: RESPIRATORY_TRACT | 90 days supply | Qty: 360 | Fill #0 | Status: CN

## 2020-06-23 MED FILL — Atorvastatin Calcium Tab 20 MG (Base Equivalent): ORAL | 30 days supply | Qty: 30 | Fill #1 | Status: CN

## 2020-06-23 MED FILL — Fluticasone Propionate Nasal Susp 50 MCG/ACT: NASAL | 30 days supply | Qty: 16 | Fill #0 | Status: CN

## 2020-06-23 MED FILL — Empagliflozin Tab 10 MG: ORAL | 30 days supply | Qty: 30 | Fill #2 | Status: CN

## 2020-06-23 MED FILL — Furosemide Tab 20 MG: ORAL | 90 days supply | Qty: 90 | Fill #0 | Status: CN

## 2020-06-23 MED FILL — Spironolactone Tab 25 MG: ORAL | 30 days supply | Qty: 30 | Fill #2 | Status: CN

## 2020-06-30 ENCOUNTER — Other Ambulatory Visit: Payer: Self-pay

## 2020-07-05 ENCOUNTER — Other Ambulatory Visit: Payer: Self-pay

## 2020-07-21 ENCOUNTER — Other Ambulatory Visit (INDEPENDENT_AMBULATORY_CARE_PROVIDER_SITE_OTHER): Payer: Self-pay | Admitting: Primary Care

## 2020-07-21 ENCOUNTER — Other Ambulatory Visit: Payer: Self-pay

## 2020-07-21 ENCOUNTER — Other Ambulatory Visit: Payer: Self-pay | Admitting: Family Medicine

## 2020-07-21 DIAGNOSIS — E1165 Type 2 diabetes mellitus with hyperglycemia: Secondary | ICD-10-CM

## 2020-07-21 DIAGNOSIS — J45909 Unspecified asthma, uncomplicated: Secondary | ICD-10-CM

## 2020-07-21 MED FILL — Empagliflozin Tab 10 MG: ORAL | 30 days supply | Qty: 30 | Fill #2 | Status: AC

## 2020-07-21 MED FILL — Atorvastatin Calcium Tab 20 MG (Base Equivalent): ORAL | 30 days supply | Qty: 30 | Fill #1 | Status: AC

## 2020-07-21 MED FILL — Spironolactone Tab 25 MG: ORAL | 30 days supply | Qty: 30 | Fill #2 | Status: AC

## 2020-07-21 MED FILL — Albuterol Sulfate Soln Nebu 0.083% (2.5 MG/3ML): RESPIRATORY_TRACT | 7 days supply | Qty: 75 | Fill #0 | Status: AC

## 2020-07-21 MED FILL — Sacubitril-Valsartan Tab 97-103 MG: ORAL | 30 days supply | Qty: 60 | Fill #0 | Status: CN

## 2020-07-21 MED FILL — Furosemide Tab 20 MG: ORAL | 90 days supply | Qty: 90 | Fill #0 | Status: AC

## 2020-07-21 NOTE — Telephone Encounter (Signed)
  Notes to clinic:  Medication was last filled by Gwinda Passe  Review for refill    Requested Prescriptions  Pending Prescriptions Disp Refills   montelukast (SINGULAIR) 10 MG tablet 30 tablet 3    Sig: TAKE 1 TABLET (10 MG TOTAL) BY MOUTH AT BEDTIME.      Pulmonology:  Leukotriene Inhibitors Passed - 07/21/2020  9:52 AM      Passed - Valid encounter within last 12 months    Recent Outpatient Visits           2 months ago Type 2 diabetes mellitus with hyperglycemia, without long-term current use of insulin (HCC)   [facility] Hoy Register, MD   5 months ago Essential hypertension   [place] [facility] [place], [place] W, NP   8 months ago Type 2 diabetes mellitus with other specified complication, without long-term current use of insulin (HCC)    [facility] Hoy Register, MD   1 year ago Encounter to establish care   [facility] Grayce Sessions, NP   1 year ago Dysuria   [facility] Storm Frisk, MD

## 2020-07-21 NOTE — Telephone Encounter (Signed)
  Notes to clinic:  This request has changes from the previous prescription Review for refill    Requested Prescriptions  Pending Prescriptions Disp Refills   sitaGLIPtin (JANUVIA) 100 MG tablet 90 tablet 0    Sig: Take 1 tablet (100 mg total) by mouth daily.      Endocrinology:  Diabetes - DPP-4 Inhibitors Passed - 07/21/2020  9:56 AM      Passed - HBA1C is between 0 and 7.9 and within 180 days    HbA1c, POC (controlled diabetic range)  Date Value Ref Range Status  04/25/2020 6.0 0.0 - 7.0 % Final          Passed - Cr in normal range and within 360 days    Creatinine, Ser  Date Value Ref Range Status  02/08/2020 0.84 0.57 - 1.00 mg/dL Final          Passed - Valid encounter within last 6 months    Recent Outpatient Visits           2 months ago Type 2 diabetes mellitus with hyperglycemia, without long-term current use of insulin (HCC)   [place] [facility] [place], Odette Horns, MD   5 months ago Essential hypertension   [place] [facility] [place], [place] W, NP   8 months ago Type 2 diabetes mellitus with other specified complication, without long-term current use of insulin (HCC)   [place] [facility] Hoy Register, MD   1 year ago Encounter to establish care   [facility] Grayce Sessions, NP   1 year ago Dysuria   [facility] Storm Frisk, MD

## 2020-07-22 ENCOUNTER — Other Ambulatory Visit: Payer: Self-pay

## 2020-07-22 MED ORDER — SITAGLIPTIN PHOSPHATE 100 MG PO TABS
100.0000 mg | ORAL_TABLET | Freq: Every day | ORAL | 0 refills | Status: DC
  Filled 2020-07-22 – 2020-08-24 (×4): qty 30, 30d supply, fill #0

## 2020-07-22 MED ORDER — MONTELUKAST SODIUM 10 MG PO TABS
ORAL_TABLET | Freq: Every day | ORAL | 3 refills | Status: AC
  Filled 2020-07-22 – 2020-08-26 (×3): qty 30, 30d supply, fill #0
  Filled 2020-10-03: qty 90, 90d supply, fill #1
  Filled 2020-10-11: qty 30, 30d supply, fill #1

## 2020-07-23 ENCOUNTER — Other Ambulatory Visit: Payer: Self-pay

## 2020-07-25 ENCOUNTER — Other Ambulatory Visit: Payer: Self-pay

## 2020-08-01 ENCOUNTER — Other Ambulatory Visit: Payer: Self-pay

## 2020-08-19 ENCOUNTER — Ambulatory Visit: Payer: Self-pay

## 2020-08-19 ENCOUNTER — Other Ambulatory Visit: Payer: Self-pay | Admitting: *Deleted

## 2020-08-19 NOTE — Patient Outreach (Signed)
Care Coordination  08/19/2020  Julie Benjamin 01/11/1970 [id number]  08/19/2020 Name: Julie Benjamin MRN: [id number] DOB: Aug 29, 1970  Referred by: Hoy Register, MD Reason for referral : High Risk Managed Medicaid (Unsuccessful initial RNCM telephone outreach)   An unsuccessful telephone outreach was attempted today. The patient was referred to the case management team for assistance with care management and care coordination.    Follow Up Plan: A HIPAA compliant phone message was left for the patient providing contact information and requesting a return call. and The Managed Medicaid care management team will reach out to the patient again over the next 7 days.    Cranford Mon RN, CCM, CDCES [place]  [facility] Care Management Coordinator - Managed [place] High Risk [phone]

## 2020-08-19 NOTE — Patient Instructions (Addendum)
Jasmine Awe ,   The [facility] Managed Care Team is available to provide assistance to you with your healthcare needs at no cost and as a benefit of your [facility] Health plan. I'm sorry I was unable to reach you today for our scheduled appointment. Our care guide will call you to reschedule our telephone appointment. Please call me at the number below. I am available to be of assistance to you regarding your healthcare needs. .   Thank you,   Cranford Mon RN, CCM, CDCES [place]  [facility] Care Management Coordinator - Managed [place] High Risk [phone]

## 2020-08-24 ENCOUNTER — Telehealth: Payer: Self-pay | Admitting: Cardiovascular Disease

## 2020-08-24 ENCOUNTER — Other Ambulatory Visit: Payer: Self-pay

## 2020-08-24 MED FILL — Atorvastatin Calcium Tab 20 MG (Base Equivalent): ORAL | 30 days supply | Qty: 30 | Fill #2 | Status: AC

## 2020-08-24 MED FILL — Carvedilol Tab 25 MG: ORAL | 90 days supply | Qty: 180 | Fill #1 | Status: AC

## 2020-08-24 MED FILL — Empagliflozin Tab 10 MG: ORAL | 30 days supply | Qty: 30 | Fill #3 | Status: AC

## 2020-08-24 MED FILL — Empagliflozin Tab 10 MG: ORAL | 30 days supply | Qty: 30 | Fill #3 | Status: CN

## 2020-08-24 MED FILL — Sacubitril-Valsartan Tab 97-103 MG: ORAL | 30 days supply | Qty: 60 | Fill #0 | Status: CN

## 2020-08-24 MED FILL — Albuterol Sulfate Soln Nebu 0.083% (2.5 MG/3ML): RESPIRATORY_TRACT | 7 days supply | Qty: 75 | Fill #1 | Status: AC

## 2020-08-24 MED FILL — Ipratropium-Albuterol Nebu Soln 0.5-2.5(3) MG/3ML: RESPIRATORY_TRACT | 30 days supply | Qty: 360 | Fill #0 | Status: AC

## 2020-08-24 MED FILL — Spironolactone Tab 25 MG: ORAL | 30 days supply | Qty: 30 | Fill #3 | Status: AC

## 2020-08-24 NOTE — Telephone Encounter (Signed)
Follow Up:     [name] is calling to follow up on prior authorization for Julie Benjamin that was requested on 03-18-20.

## 2020-08-25 NOTE — Telephone Encounter (Signed)
**Note De-Identified Julie Benjamin Obfuscation** Unaware of Entresto PA need until now. I have started an urgent prior auth through covermymeds. Key: [id number]  I have tried to call [facility] to discuss X 2 but was on hold for more that 5 mins each time.

## 2020-08-25 NOTE — Telephone Encounter (Signed)
Routed to primary nurse as FYI 

## 2020-08-26 ENCOUNTER — Other Ambulatory Visit: Payer: Self-pay

## 2020-08-26 MED FILL — Sacubitril-Valsartan Tab 97-103 MG: ORAL | 30 days supply | Qty: 60 | Fill #0 | Status: AC

## 2020-08-26 NOTE — Telephone Encounter (Signed)
**Note De-Identified Dione Petron Obfuscation** Jasmine Awe Key: [id number] - PA Case ID: [id number] Outcome:Approved on August 25 Request Reference Number: [id number]. ENTRESTO TAB 97-103MG  is approved through 08/25/2021.  Drug: Sherryll Burger 97-103MG  tablets Form: [employer] Medicaid Electronic Prior Authorization Form (2017 NCPDP)  I have notified [facility] of this approval.

## 2020-09-09 ENCOUNTER — Other Ambulatory Visit: Payer: Self-pay

## 2020-10-03 ENCOUNTER — Other Ambulatory Visit: Payer: Self-pay | Admitting: Family Medicine

## 2020-10-03 ENCOUNTER — Other Ambulatory Visit: Payer: Self-pay

## 2020-10-03 DIAGNOSIS — E1165 Type 2 diabetes mellitus with hyperglycemia: Secondary | ICD-10-CM

## 2020-10-03 MED FILL — Atorvastatin Calcium Tab 20 MG (Base Equivalent): ORAL | 90 days supply | Qty: 90 | Fill #3 | Status: CN

## 2020-10-03 MED FILL — Ipratropium-Albuterol Nebu Soln 0.5-2.5(3) MG/3ML: RESPIRATORY_TRACT | 30 days supply | Qty: 360 | Fill #1 | Status: CN

## 2020-10-03 MED FILL — Furosemide Tab 20 MG: ORAL | 90 days supply | Qty: 90 | Fill #1 | Status: CN

## 2020-10-04 ENCOUNTER — Other Ambulatory Visit: Payer: Self-pay

## 2020-10-04 ENCOUNTER — Other Ambulatory Visit: Payer: Self-pay | Admitting: Pharmacist

## 2020-10-04 DIAGNOSIS — J45909 Unspecified asthma, uncomplicated: Secondary | ICD-10-CM

## 2020-10-04 MED ORDER — ALBUTEROL SULFATE HFA 108 (90 BASE) MCG/ACT IN AERS
INHALATION_SPRAY | RESPIRATORY_TRACT | 2 refills | Status: AC
  Filled 2020-10-04 – 2020-11-15 (×2): qty 18, 25d supply, fill #0

## 2020-10-04 NOTE — Telephone Encounter (Signed)
Requested medications are due for refill today yes  Requested medications are on the active medication list yes  Last refill 08/26/20  Last visit 04/25/20  Future visit scheduled no  Notes to clinic Has already had a curtesy refill and there is no upcoming appointment scheduled.

## 2020-10-04 NOTE — Telephone Encounter (Signed)
Requested medications are due for refill today yes  Requested medications are on the active medication list yes  Last refill 08/26/20  Last visit 04/25/20  Future visit scheduled no  Notes to clinic Has already had a curtesy refill and there is no upcoming appointment scheduled.   

## 2020-10-05 ENCOUNTER — Other Ambulatory Visit: Payer: Self-pay

## 2020-10-10 ENCOUNTER — Other Ambulatory Visit: Payer: Self-pay

## 2020-10-11 ENCOUNTER — Other Ambulatory Visit: Payer: Self-pay

## 2020-10-11 MED FILL — Ipratropium-Albuterol Nebu Soln 0.5-2.5(3) MG/3ML: RESPIRATORY_TRACT | 30 days supply | Qty: 360 | Fill #1 | Status: CN

## 2020-10-11 MED FILL — Furosemide Tab 20 MG: ORAL | 90 days supply | Qty: 90 | Fill #1 | Status: CN

## 2020-10-11 MED FILL — Atorvastatin Calcium Tab 20 MG (Base Equivalent): ORAL | 30 days supply | Qty: 30 | Fill #3 | Status: CN

## 2020-10-14 ENCOUNTER — Other Ambulatory Visit: Payer: Self-pay

## 2020-10-18 ENCOUNTER — Other Ambulatory Visit: Payer: Self-pay

## 2020-11-11 ENCOUNTER — Other Ambulatory Visit: Payer: Self-pay

## 2020-11-15 ENCOUNTER — Other Ambulatory Visit: Payer: Self-pay

## 2020-11-15 ENCOUNTER — Telehealth (HOSPITAL_BASED_OUTPATIENT_CLINIC_OR_DEPARTMENT_OTHER): Payer: Medicaid Other | Admitting: Nurse Practitioner

## 2020-11-15 ENCOUNTER — Telehealth: Payer: Self-pay | Admitting: Family Medicine

## 2020-11-15 ENCOUNTER — Encounter: Payer: Self-pay | Admitting: Nurse Practitioner

## 2020-11-15 DIAGNOSIS — E1165 Type 2 diabetes mellitus with hyperglycemia: Secondary | ICD-10-CM | POA: Diagnosis not present

## 2020-11-15 MED ORDER — SITAGLIPTIN PHOSPHATE 100 MG PO TABS
100.0000 mg | ORAL_TABLET | Freq: Every day | ORAL | 0 refills | Status: AC
  Filled 2020-11-15: qty 30, 30d supply, fill #0

## 2020-11-15 MED ORDER — EMPAGLIFLOZIN 10 MG PO TABS
ORAL_TABLET | Freq: Every day | ORAL | 0 refills | Status: AC
  Filled 2020-11-15: qty 30, 30d supply, fill #0

## 2020-11-15 MED FILL — Furosemide Tab 20 MG: ORAL | 90 days supply | Qty: 90 | Fill #1 | Status: AC

## 2020-11-15 MED FILL — Albuterol Sulfate Soln Nebu 0.083% (2.5 MG/3ML): RESPIRATORY_TRACT | 7 days supply | Qty: 75 | Fill #2 | Status: AC

## 2020-11-15 MED FILL — Ipratropium-Albuterol Nebu Soln 0.5-2.5(3) MG/3ML: RESPIRATORY_TRACT | 30 days supply | Qty: 360 | Fill #1 | Status: AC

## 2020-11-15 MED FILL — Atorvastatin Calcium Tab 20 MG (Base Equivalent): ORAL | 30 days supply | Qty: 30 | Fill #3 | Status: AC

## 2020-11-15 NOTE — Telephone Encounter (Signed)
Patient called to discuss her refills requested. Advised her last refill for Januvia was denied due to she needs an appointment. She says she has moved out of town suddenly and has not found a new provider due to her insurance is still not active where she lives currently. I advised to scheduled a virtual visit with a provider in order to receive refills and discuss at that time her situation, she verbalized understanding. Virtual appointment scheduled for today at 1610 with Bertram Denver, NP. Advised to call the cardiologist for refills of Spironolactone and Jardiance, since that's who refilled last, she verbalized understanding.

## 2020-11-15 NOTE — Telephone Encounter (Signed)
Medication Refill - Medication: empagliflozin (JARDIANCE) 10 MG TABS tablet sitaGLIPtin (JANUVIA) 100 MG tablet spironolactone (ALDACTONE) 25 MG tablet  Has the patient contacted their pharmacy? No. Pt states she called a month ago and never heard back. She has been out of medication for a month.  Requesting 30 day supply until she can get into another dr .  She has moved to [place].  Someone is traveling to see her in a couple days, and can bring to her if you can send to pharmacy.  Preferred Pharmacy (with phone number or street name):  [facility] Has the patient been seen for an appointment in the last year OR does the patient have an upcoming appointment? Yes.    Agent: Please be advised that RX refills may take up to 3 business days. We ask that you follow-up with your pharmacy.

## 2020-11-15 NOTE — Progress Notes (Signed)
Virtual Visit via Telephone Note Due to national recommendations of social distancing due to COVID 19, telehealth visit is felt to be most appropriate for this patient at this time.  I discussed the limitations, risks, security and privacy concerns of performing an evaluation and management service by telephone and the availability of in person appointments. I also discussed with the patient that there may be a patient responsible charge related to this service. The patient expressed understanding and agreed to proceed.    I connected with Julie Benjamin on 11/15/20  at   4:10 PM EST  EDT by telephone and verified that I am speaking with the correct person using two identifiers.  Location of Patient: Private Residence   Location of Provider: [facility]    Persons participating in Telemedicine visit: Bertram Denver FNP-BC Rudi Knippenberg    History of Present Illness: Telemedicine visit for: DM She has a past medical history of Acute respiratory failure with hypoxia (11/01/2018), Asthma, CHF, DM2, Hypertension, and Respiratory failure with hypoxia (11/01/2018).    DM 2 Well controlled She recently moved to [place]. Will be establishing with a clinic there soon. Requesting refill of her Jardiance and Januvia today. Reports blood glucose levels as good but no specific readings given today.  She denies any symptoms of hypo or hyperglycemia.  Lab Results  Component Value Date   HGBA1C 6.0 04/25/2020       Past Medical History:  Diagnosis Date   Acute respiratory failure with hypoxia (HCC) 11/01/2018   Asthma    CHF (congestive heart failure) (HCC)    Diabetes mellitus without complication (HCC)    Hypertension    Respiratory failure with hypoxia (HCC) 11/01/2018    Past Surgical History:  Procedure Laterality Date   CESAREAN SECTION     RIGHT/LEFT HEART CATH AND CORONARY ANGIOGRAPHY N/A 11/05/2018   Procedure: RIGHT/LEFT HEART CATH AND CORONARY  ANGIOGRAPHY;  Surgeon: Kathleene Hazel, MD;  Location: [facility];  Service: Cardiovascular;  Laterality: N/A;   TUBAL LIGATION  2001    Family History  Problem Relation Age of Onset   Hypertension Mother    COPD Mother    Kidney failure Sister     Social History   Socioeconomic History   Marital status: Single    Spouse name: Not on file   Number of children: 6   Years of education: Not on file   Highest education level: Not on file  Occupational History   Occupation: disabled  Tobacco Use   Smoking status: Former    Packs/day: 1.00    Years: 2.00    Pack years: 2.00    Types: Cigarettes    Start date: 8    Quit date: 1992    Years since quitting: 30.8   Smokeless tobacco: Never  Substance and Sexual Activity   Alcohol use: Never   Drug use: Never   Sexual activity: Not on file  Other Topics Concern   Not on file  Social History Narrative   Not on file   Social Determinants of Health   Financial Resource Strain: Not on file  Food Insecurity: Not on file  Transportation Needs: Not on file  Physical Activity: Not on file  Stress: Not on file  Social Connections: Not on file     Observations/Objective: Awake, alert and oriented x 3   Review of Systems  Constitutional:  Negative for fever, malaise/fatigue and weight loss.  HENT: Negative.  Negative for nosebleeds.  Eyes: Negative.  Negative for blurred vision, double vision and photophobia.  Respiratory: Negative.  Negative for cough and shortness of breath.   Cardiovascular: Negative.  Negative for chest pain, palpitations and leg swelling.  Gastrointestinal: Negative.  Negative for heartburn, nausea and vomiting.  Musculoskeletal: Negative.  Negative for myalgias.  Neurological: Negative.  Negative for dizziness, focal weakness, seizures and headaches.  Psychiatric/Behavioral: Negative.  Negative for suicidal ideas.    Assessment and Plan: Diagnoses and all orders for this  visit:  Type 2 diabetes mellitus with hyperglycemia, without long-term current use of insulin (HCC) -     empagliflozin (JARDIANCE) 10 MG TABS tablet; TAKE 1 TABLET (10 MG TOTAL) BY MOUTH DAILY BEFORE BREAKFAST. -     sitaGLIPtin (JANUVIA) 100 MG tablet; Take 1 tablet (100 mg total) by mouth daily.    Follow Up Instructions Return if symptoms worsen or fail to improve.     I discussed the assessment and treatment plan with the patient. The patient was provided an opportunity to ask questions and all were answered. The patient agreed with the plan and demonstrated an understanding of the instructions.   The patient was advised to call back or seek an in-person evaluation if the symptoms worsen or if the condition fails to improve as anticipated.  I provided 10 minutes of non-face-to-face time during this encounter including median intraservice time, reviewing previous notes, labs, imaging, medications and explaining diagnosis and management.  Claiborne Rigg, FNP-BC

## 2020-11-16 ENCOUNTER — Other Ambulatory Visit: Payer: Self-pay

## 2020-12-22 ENCOUNTER — Other Ambulatory Visit: Payer: Self-pay

## 2021-06-07 ENCOUNTER — Other Ambulatory Visit: Payer: Self-pay

## 2023-01-07 IMAGING — MG DIGITAL SCREENING BILAT W/ CAD
8 of 10 series · 8 of 10 positions shown · non-contrast
Comparison: None.

ACR Breast Density Category a: The breast tissue is almost entirely
fatty.

CLINICAL DATA: Screening.

EXAM:
DIGITAL SCREENING BILATERAL MAMMOGRAM WITH CAD
TECHNIQUE: Bilateral screening digital craniocaudal and mediolateral oblique
mammograms were obtained. The images were evaluated with
computer-aided detection.

[R CC]
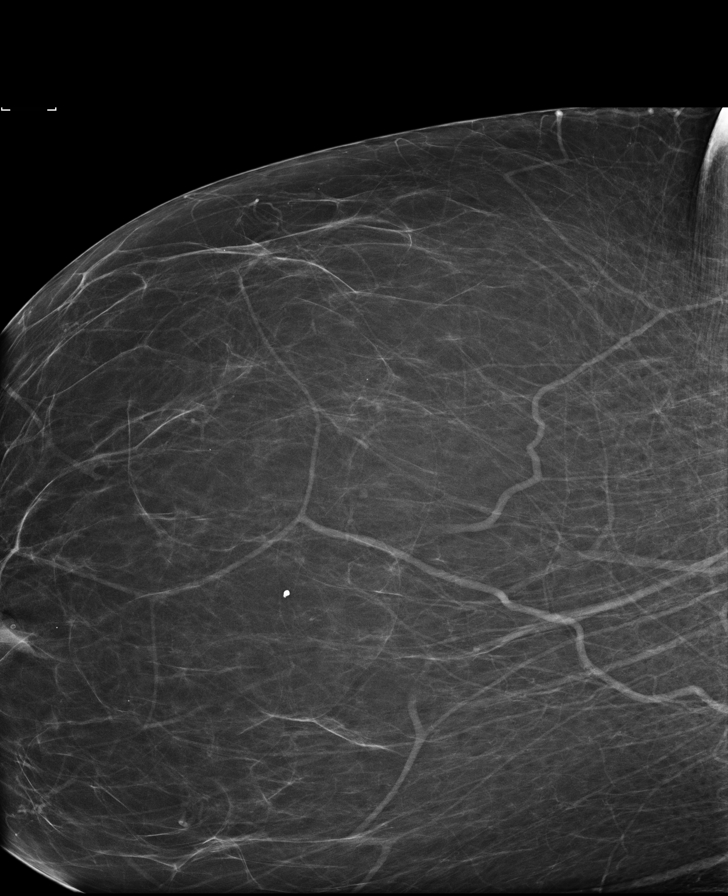

[R CV]
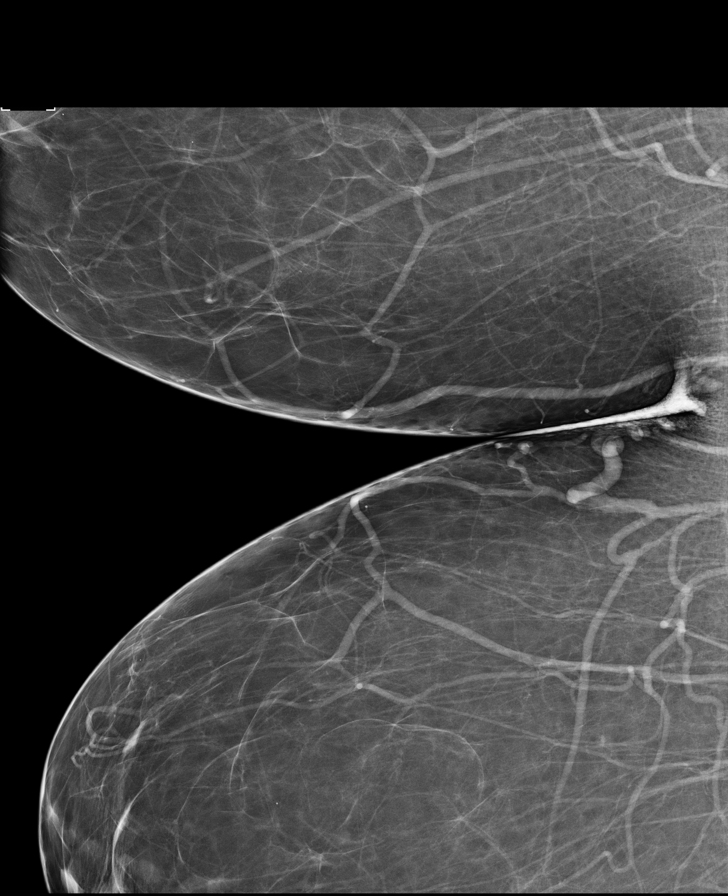

[L CC (1 of 3)]
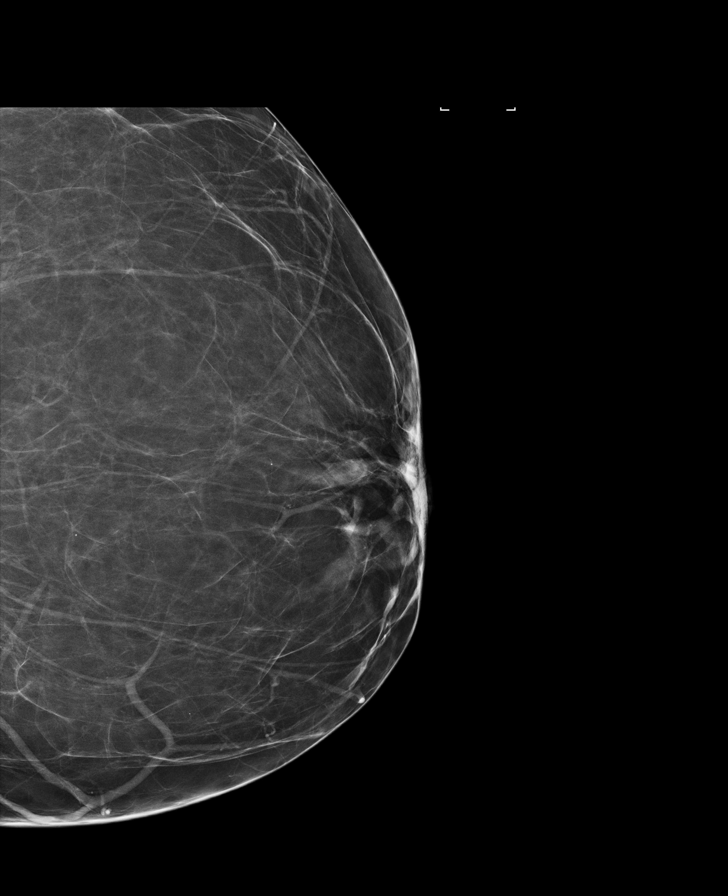

[R MLO (1 of 2)]
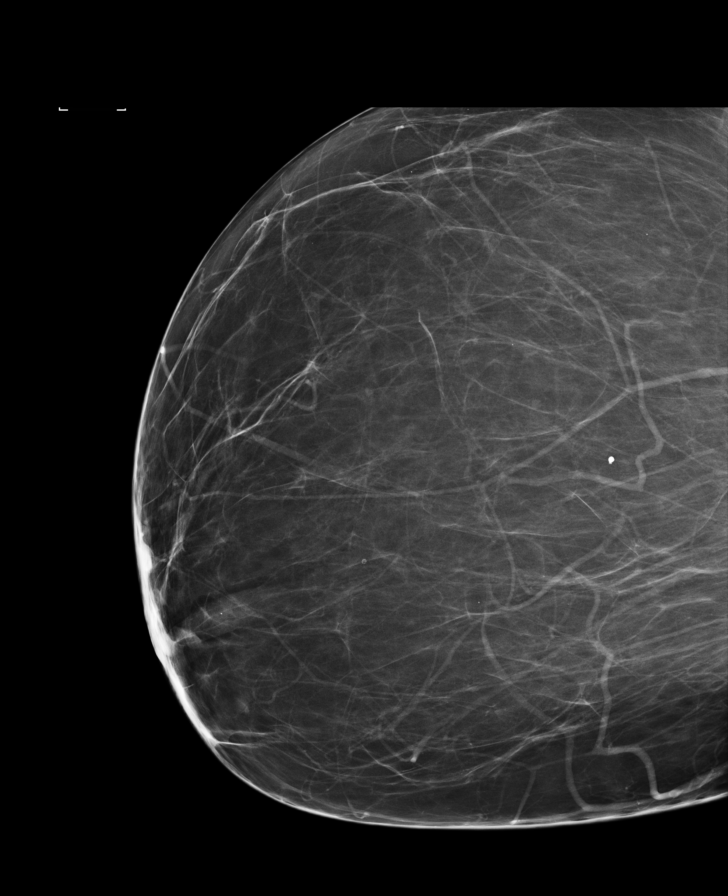

[L CC (2 of 3)]
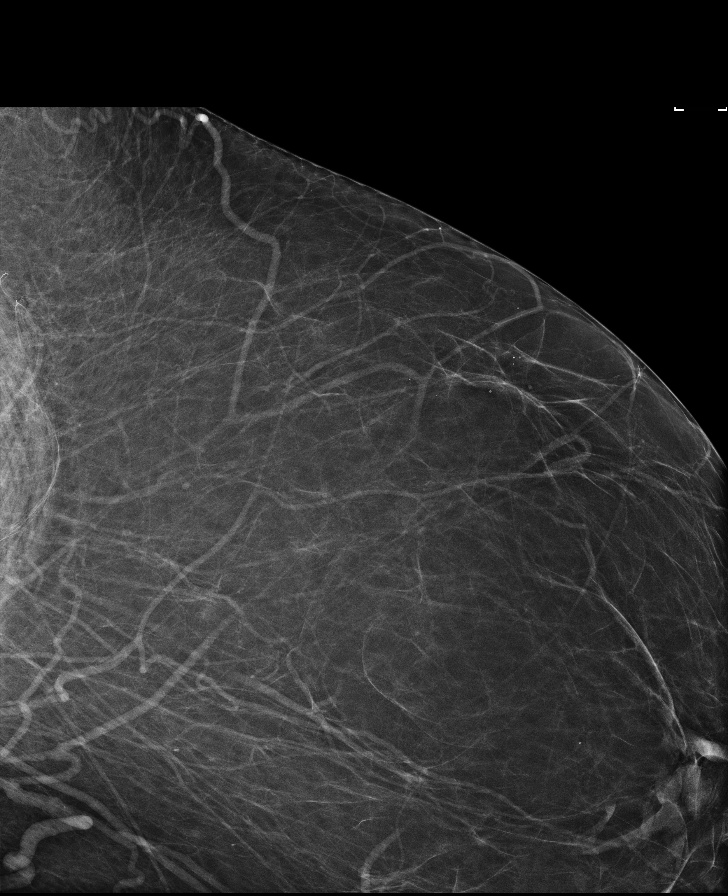

[L MLO]
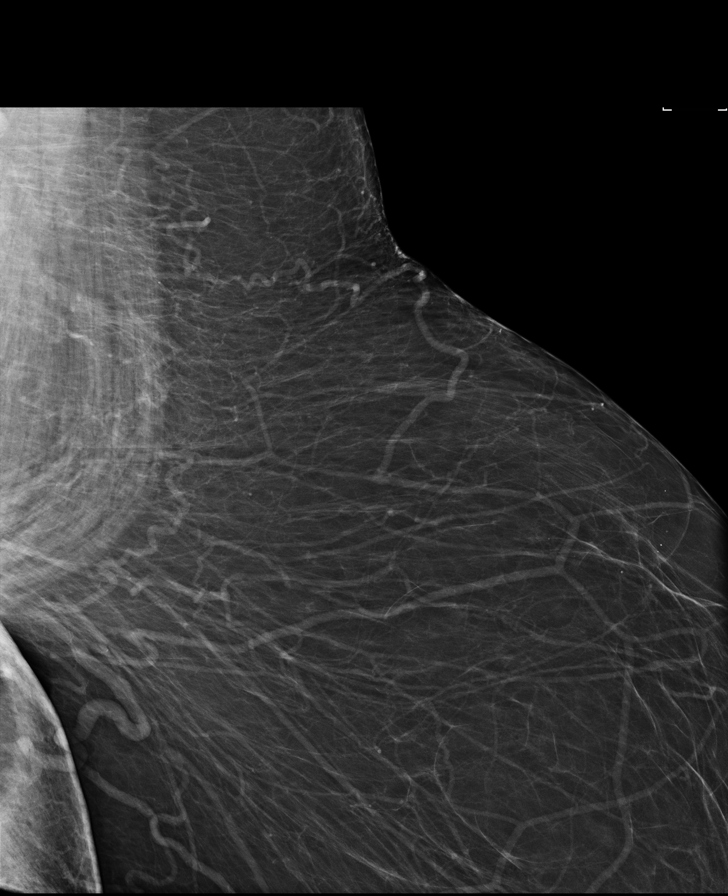

[L CC (3 of 3)]
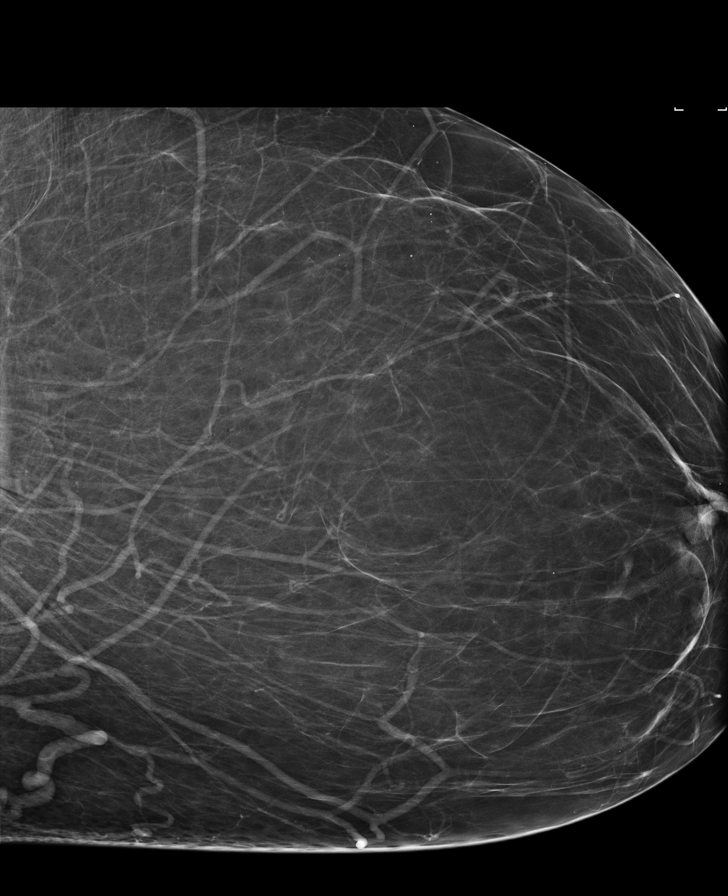

[R MLO (2 of 2)]
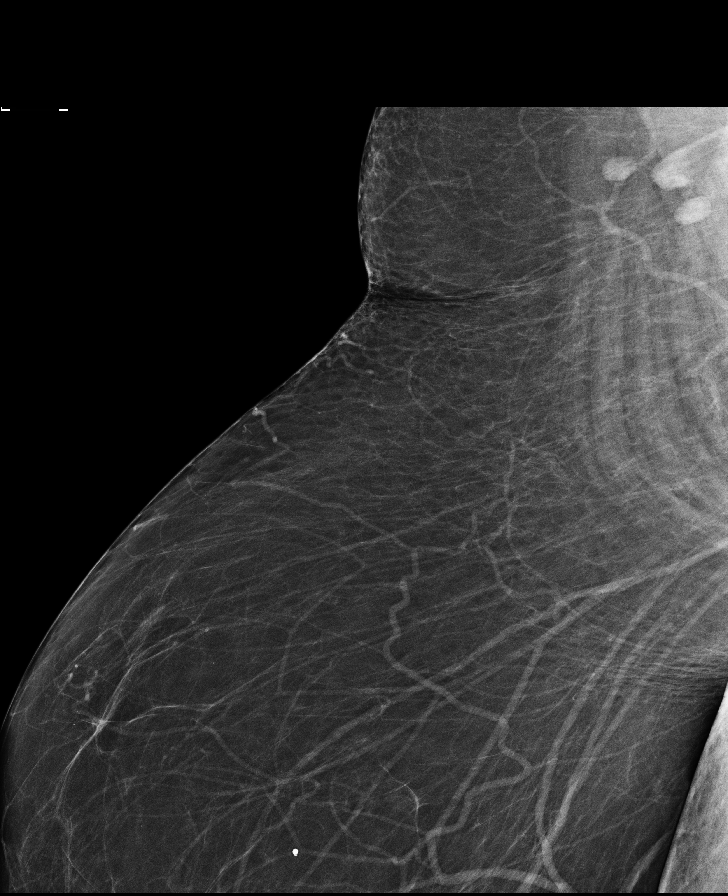

[8 of 10 positions shown; findings below may reference images not displayed]

FINDINGS: There are no findings suspicious for malignancy. The images were
evaluated with computer-aided detection.
IMPRESSION: No mammographic evidence of malignancy. A result letter of this
screening mammogram will be mailed directly to the patient.

RECOMMENDATION:
Screening mammogram in one year. (Code:08-U-L6A)

BI-RADS CATEGORY  1: Negative.

## 2023-04-02 DEATH — deceased
